# Patient Record
Sex: Female | Born: 1975 | Race: White | Hispanic: No | Marital: Single | Smoking: Never smoker
Health system: Southern US, Community
[De-identification: ages and names within clinical notes are randomized; demographics above are authoritative.]

## PROBLEM LIST (undated history)

## (undated) DIAGNOSIS — R945 Abnormal results of liver function studies: Secondary | ICD-10-CM

## (undated) DIAGNOSIS — K6289 Other specified diseases of anus and rectum: Secondary | ICD-10-CM

## (undated) DIAGNOSIS — R7989 Other specified abnormal findings of blood chemistry: Secondary | ICD-10-CM

## (undated) DIAGNOSIS — K625 Hemorrhage of anus and rectum: Secondary | ICD-10-CM

## (undated) DIAGNOSIS — Z5189 Encounter for other specified aftercare: Secondary | ICD-10-CM

## (undated) DIAGNOSIS — IMO0001 Reserved for inherently not codable concepts without codable children: Secondary | ICD-10-CM

## (undated) DIAGNOSIS — K859 Acute pancreatitis without necrosis or infection, unspecified: Secondary | ICD-10-CM

## (undated) DIAGNOSIS — H9193 Unspecified hearing loss, bilateral: Secondary | ICD-10-CM

## (undated) DIAGNOSIS — N200 Calculus of kidney: Secondary | ICD-10-CM

## (undated) DIAGNOSIS — D693 Immune thrombocytopenic purpura: Secondary | ICD-10-CM

## (undated) DIAGNOSIS — K648 Other hemorrhoids: Secondary | ICD-10-CM

## (undated) HISTORY — DX: Other specified abnormal findings of blood chemistry: R79.89

## (undated) HISTORY — DX: Immune thrombocytopenic purpura: D69.3

## (undated) HISTORY — DX: Encounter for other specified aftercare: Z51.89

## (undated) HISTORY — DX: Abnormal results of liver function studies: R94.5

## (undated) HISTORY — DX: Acute pancreatitis without necrosis or infection, unspecified: K85.90

## (undated) HISTORY — DX: Unspecified hearing loss, bilateral: H91.93

## (undated) HISTORY — DX: Calculus of kidney: N20.0

## (undated) HISTORY — DX: Other specified diseases of anus and rectum: K62.89

## (undated) HISTORY — PX: TUBAL LIGATION: SHX77

## (undated) HISTORY — DX: Reserved for inherently not codable concepts without codable children: IMO0001

## (undated) HISTORY — DX: Hemorrhage of anus and rectum: K62.5

---

## 2007-10-09 HISTORY — PX: NOVASURE ABLATION: SHX5394

## 2007-10-09 HISTORY — PX: CHOLECYSTECTOMY: SHX55

## 2009-07-02 ENCOUNTER — Ambulatory Visit: Payer: Self-pay | Admitting: Diagnostic Radiology

## 2009-07-02 ENCOUNTER — Emergency Department (HOSPITAL_BASED_OUTPATIENT_CLINIC_OR_DEPARTMENT_OTHER): Admission: EM | Admit: 2009-07-02 | Discharge: 2009-07-02 | Payer: Self-pay | Admitting: Emergency Medicine

## 2011-03-22 ENCOUNTER — Emergency Department (HOSPITAL_BASED_OUTPATIENT_CLINIC_OR_DEPARTMENT_OTHER)
Admission: EM | Admit: 2011-03-22 | Discharge: 2011-03-22 | Disposition: A | Discharge status: Home / Self Care | Payer: Self-pay | Attending: Emergency Medicine | Admitting: Emergency Medicine

## 2011-03-22 ENCOUNTER — Emergency Department (INDEPENDENT_AMBULATORY_CARE_PROVIDER_SITE_OTHER): Payer: Self-pay

## 2011-03-22 DIAGNOSIS — D696 Thrombocytopenia, unspecified: Secondary | ICD-10-CM | POA: Insufficient documentation

## 2011-03-22 DIAGNOSIS — R079 Chest pain, unspecified: Secondary | ICD-10-CM

## 2011-03-22 LAB — CBC
MCH: 32.2 pg (ref 26.0–34.0)
MCV: 91.5 fL (ref 78.0–100.0)
Platelets: 90 10*3/uL — ABNORMAL LOW (ref 150–400)
RBC: 3.88 MIL/uL (ref 3.87–5.11)

## 2011-03-22 LAB — DIFFERENTIAL
Basophils Relative: 0 % (ref 0–1)
Eosinophils Absolute: 0.1 10*3/uL (ref 0.0–0.7)
Lymphocytes Relative: 16 % (ref 12–46)
Lymphs Abs: 1.3 10*3/uL (ref 0.7–4.0)
Neutro Abs: 6.4 10*3/uL (ref 1.7–7.7)

## 2011-03-22 LAB — BASIC METABOLIC PANEL
BUN: 16 mg/dL (ref 6–23)
Calcium: 9.9 mg/dL (ref 8.4–10.5)
Creatinine, Ser: 0.7 mg/dL (ref 0.4–1.2)
GFR calc non Af Amer: >60 mL/min (ref >60)
Glucose, Bld: 112 mg/dL — ABNORMAL HIGH (ref 70–99)
Potassium: 3.6 mEq/L (ref 3.5–5.1)

## 2011-10-09 DIAGNOSIS — K648 Other hemorrhoids: Secondary | ICD-10-CM

## 2011-10-09 HISTORY — DX: Other hemorrhoids: K64.8

## 2011-10-12 ENCOUNTER — Encounter: Payer: Self-pay | Admitting: Internal Medicine

## 2011-11-05 ENCOUNTER — Encounter: Payer: Self-pay | Admitting: Family Medicine

## 2011-11-05 ENCOUNTER — Other Ambulatory Visit (HOSPITAL_COMMUNITY)
Admission: RE | Admit: 2011-11-05 | Discharge: 2011-11-05 | Disposition: A | Discharge status: Home / Self Care | Payer: BC Managed Care – PPO | Source: Ambulatory Visit | Attending: Family Medicine | Admitting: Family Medicine

## 2011-11-05 ENCOUNTER — Ambulatory Visit (INDEPENDENT_AMBULATORY_CARE_PROVIDER_SITE_OTHER): Payer: BC Managed Care – PPO | Admitting: Family Medicine

## 2011-11-05 VITALS — HR 85 | Temp 98.5°F | Ht 61.0 in | Wt 133.8 lb

## 2011-11-05 DIAGNOSIS — Z01419 Encounter for gynecological examination (general) (routine) without abnormal findings: Secondary | ICD-10-CM | POA: Insufficient documentation

## 2011-11-05 DIAGNOSIS — D693 Immune thrombocytopenic purpura: Secondary | ICD-10-CM

## 2011-11-05 DIAGNOSIS — N920 Excessive and frequent menstruation with regular cycle: Secondary | ICD-10-CM

## 2011-11-05 DIAGNOSIS — H9190 Unspecified hearing loss, unspecified ear: Secondary | ICD-10-CM

## 2011-11-05 DIAGNOSIS — L723 Sebaceous cyst: Secondary | ICD-10-CM

## 2011-11-05 DIAGNOSIS — Z0000 Encounter for general adult medical examination without abnormal findings: Secondary | ICD-10-CM

## 2011-11-05 DIAGNOSIS — H9193 Unspecified hearing loss, bilateral: Secondary | ICD-10-CM | POA: Insufficient documentation

## 2011-11-05 DIAGNOSIS — L729 Follicular cyst of the skin and subcutaneous tissue, unspecified: Secondary | ICD-10-CM

## 2011-11-05 LAB — BASIC METABOLIC PANEL
CO2: 28 mEq/L (ref 19–32)
Calcium: 9.6 mg/dL (ref 8.4–10.5)
Creatinine, Ser: 0.8 mg/dL (ref 0.4–1.2)
Glucose, Bld: 79 mg/dL (ref 70–99)

## 2011-11-05 LAB — LIPID PANEL
HDL: 69.6 mg/dL (ref >39.00)
Total CHOL/HDL Ratio: 3
VLDL: 8.2 mg/dL (ref 0.0–40.0)

## 2011-11-05 LAB — CBC WITH DIFFERENTIAL/PLATELET
Basophils Absolute: 0 10*3/uL (ref 0.0–0.1)
Eosinophils Absolute: 0 10*3/uL (ref 0.0–0.7)
Lymphocytes Relative: 21.5 % (ref 12.0–46.0)
MCHC: 33.9 g/dL (ref 30.0–36.0)
Neutrophils Relative %: 70.5 % (ref 43.0–77.0)
RBC: 3.71 Mil/uL — ABNORMAL LOW (ref 3.87–5.11)
RDW: 13.1 % (ref 11.5–14.6)

## 2011-11-05 LAB — POCT URINALYSIS DIPSTICK
Ketones, UA: NEGATIVE
Protein, UA: NEGATIVE
pH, UA: 5

## 2011-11-05 LAB — MICROALBUMIN / CREATININE URINE RATIO: Microalb, Ur: 0.6 mg/dL (ref 0.0–1.9)

## 2011-11-05 LAB — TSH: TSH: 1.19 u[IU]/mL (ref 0.35–5.50)

## 2011-11-05 LAB — HEPATIC FUNCTION PANEL
Alkaline Phosphatase: 46 U/L (ref 39–117)
Bilirubin, Direct: 0.1 mg/dL (ref 0.0–0.3)

## 2011-11-05 NOTE — Progress Notes (Signed)
Subjective:     Brittany Austin is a 36 y.o. female and is here for a comprehensive physical exam. The patient reports problems - heavy periods.  Pt had surgery  in [place]  to stop bleeding but it did not work.  pt requesting gyn referral here.  Pt also has cysts on scalp that are worsening and she would like to be referred for that as well.    History   Social History  . Marital Status: Single    Spouse Name: N/A    Number of Children: N/A  . Years of Education: N/A   Occupational History  . 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 Smoking status: Never Smoker   . Smokeless tobacco: Never Used  . Alcohol Use: Yes  . Drug Use: No  . Sexually Active: Not Currently   Other Topics Concern  . Not on file   Social History Narrative  . No narrative on file   Health Maintenance  Topic Date Due  . Pap Smear  09/17/1996  . Influenza Vaccine  07/08/2012  . Tetanus/tdap  06/08/2020    The following portions of the patient's history were reviewed and updated as appropriate: allergies, current medications, past family history, past medical history, past social history, past surgical history and problem list.  Review of Systems Review of Systems  Constitutional: Negative for activity change, appetite change and fatigue.  HENT: Negative for hearing loss, congestion, tinnitus and ear discharge.  dentist q24m Eyes: Negative for visual disturbance (see optho q1y -- vision corrected to 20/20 with glasses).  Respiratory: Negative for cough, chest tightness and shortness of breath.   Cardiovascular: Negative for chest pain, palpitations and leg swelling.  Gastrointestinal: Negative for abdominal pain, diarrhea, constipation and abdominal distention.  Genitourinary: Negative for urgency, frequency, decreased urine volume and difficulty urinating.  Musculoskeletal: Negative for back pain, arthralgias and gait problem.  Skin: Negative for color change, pallor  and rash.  Neurological: Negative for dizziness, light-headedness, numbness and headaches.  Hematological: Negative for adenopathy. Does not bruise/bleed easily.  Psychiatric/Behavioral: Negative for suicidal ideas, confusion, sleep disturbance, self-injury, dysphoric mood, decreased concentration and agitation.       Objective:    BP 110/68  Pulse 85  Temp(Src) 98.5 F (36.9 C) (Oral)  Ht 5\' 1"  (1.549 m)  Wt 133 lb 12.8 oz (60.691 kg)  BMI 25.28 kg/m2  SpO2 98%  LMP 10/18/2011 General appearance: alert, cooperative, appears stated age and no distress Head: Normocephalic, without obvious abnormality, atraumatic Eyes: conjunctivae/corneas clear. PERRL, EOM's intact. Fundi benign. Ears: normal TM's and external ear canals both ears Nose: Nares normal. Septum midline. Mucosa normal. No drainage or sinus tenderness. Throat: lips, mucosa, and tongue normal; teeth and gums normal Neck: no adenopathy, no carotid bruit, no JVD, supple, symmetrical, trachea midline and thyroid not enlarged, symmetric, no tenderness/mass/nodules Back: symmetric, no curvature. ROM normal. No CVA tenderness. Lungs: clear to auscultation bilaterally Breasts: normal appearance, no masses or tenderness Heart: regular rate and rhythm, S1, S2 normal, no murmur, click, rub or gallop Abdomen: soft, non-tender; bowel sounds normal; no masses,  no organomegaly Pelvic: cervix normal in appearance, external genitalia normal, no adnexal masses or tenderness, no cervical motion tenderness, rectovaginal septum normal, uterus normal size, shape, and consistency and vagina normal without discharge Extremities: extremities normal, atraumatic, no cyanosis or edema Pulses: 2+ and symmetric Skin: Skin color, texture, turgor normal. No rashes or lesions Lymph nodes: Cervical, supraclavicular, and axillary  nodes normal. Neurologic: Alert and oriented X 3, normal strength and tone. Normal symmetric reflexes. Normal coordination  and gait psych-- no depression or anxiety    Assessment:    Healthy female exam.  ITP-- stable per pt Hearing loss===  B/L hearing aids     Plan:    ghm utd Check fasting labs See After Visit Summary for Counseling Recommendations

## 2011-11-05 NOTE — Patient Instructions (Signed)

## 2011-11-05 NOTE — Progress Notes (Signed)
Addended by: Legrand Como on: 11/05/2011 01:27 PM   Modules accepted: Orders

## 2011-11-06 ENCOUNTER — Ambulatory Visit (INDEPENDENT_AMBULATORY_CARE_PROVIDER_SITE_OTHER): Payer: BC Managed Care – PPO | Admitting: Internal Medicine

## 2011-11-06 ENCOUNTER — Ambulatory Visit: Payer: Self-pay | Admitting: Internal Medicine

## 2011-11-06 ENCOUNTER — Encounter: Payer: Self-pay | Admitting: Internal Medicine

## 2011-11-06 VITALS — BP 92/50 | HR 72 | Ht 59.0 in | Wt 132.6 lb

## 2011-11-06 DIAGNOSIS — K5909 Other constipation: Secondary | ICD-10-CM

## 2011-11-06 DIAGNOSIS — K602 Anal fissure, unspecified: Secondary | ICD-10-CM

## 2011-11-06 DIAGNOSIS — K648 Other hemorrhoids: Secondary | ICD-10-CM

## 2011-11-06 DIAGNOSIS — K5900 Constipation, unspecified: Secondary | ICD-10-CM

## 2011-11-06 LAB — RUBEOLA ANTIBODY IGG: Rubeola IgG: 7.53 {ISR} — ABNORMAL HIGH

## 2011-11-06 LAB — MUMPS ANTIBODY, IGG: Mumps IgG: 5.18 {ISR} — ABNORMAL HIGH

## 2011-11-06 MED ORDER — POLYETHYLENE GLYCOL 3350 17 GM/SCOOP PO POWD: 17.0000 g | Freq: Every day | ORAL | Status: DC

## 2011-11-06 MED ORDER — HYDROCORTISONE ACETATE 25 MG RE SUPP: 25.0000 mg | Freq: Every day | RECTAL | Status: AC

## 2011-11-06 MED ORDER — AMBULATORY NON FORMULARY MEDICATION: 1.0000 "application " | Freq: Two times a day (BID) | Status: DC

## 2011-11-06 NOTE — Progress Notes (Signed)
Subjective:    Patient ID: Brittany Austin, female    DOB: 1976/09/28, 36 y.o.   MRN: [id number]  HPI She believes she has had hemorrhoids for a number of years. Had seen a gastroenterologist in 2009 [facility]) for abdominal pain and cholecystectomty was recommended for gallbladder dyskinesia. That relieved her pain. Back then she was having anal itching and was found hemorrhoids on exam. Over the past few months she went on a weight loss diet - Ally plan without the medication. She has always gone several days without defecation and that worsened some and the stools became larger and harder on the diet. She would finally pass a large hard stool and followed by softer stools. Began to use mineral oil enemas every 3 days to promote defecation. She started having my "hemorrhoids split and bleed and would have painful defecation. Now using pine nut oil to improve defecation. Moving bowels about daily now. Still hard, seeing pellet-like stools. Still has anorectal pain with defecation, sharp and tearing. Only seeing blood on toilet paper except for 1-2 x in toilet. Always bright red.Some abdominal pain and sense of defecation. She tried stool softeners and a probiotic without relief. Kiwi fruit helps. She does not tolerate fiber - it gives me very loose stools (Raisin Bran did)  No Known Allergies Outpatient Prescriptions Prior to Visit  Medication Sig Dispense Refill  . Multiple Vitamin (MULTIVITAMIN) tablet Take 1 tablet by mouth daily.       Past Medical History  Diagnosis Date  . ITP (idiopathic thrombocytopenic purpura)   . Hearing loss of both ears   . Kidney stones    Past Surgical History  Procedure Date  . Cholecystectomy   . Cesarean section     x's 2  . Novasure ablation   . Tubal ligation    History   Social History  . Marital Status: Single    Spouse Name: N/A    Number of Children: 2  . Years of Education: N/A   Occupational History  . 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 Smoking status: Never Smoker   . Smokeless tobacco: Never Used  . Alcohol Use: No  . Drug Use: No  . Sexually Active: Not Currently   Other Topics Concern  . None   Social History Narrative  . None   Family History  Problem Relation Age of Onset  . Arthritis    . Colon cancer Paternal Uncle   . Hyperlipidemia Mother   . Hyperlipidemia Father   . Heart disease Mother   . Heart disease Paternal Grandmother   . Stroke Maternal Grandmother   . Hypertension Mother   . Diabetes Mother   . Diabetes Maternal Grandmother   . Diabetes Paternal Grandmother   . Cancer Paternal Grandfather     unknown source        Review of Systems Denies any other active problems on a complete review of systems other than chronic hearing loss.Thought to be congenital.    Objective:   Physical Exam General:  Well-developed, well-nourished and in no acute distress Eyes:  anicteric. ENT:   Mouth and posterior pharynx free of lesions.  Neck:   supple w/o thyromegaly or mass.  Lungs: Clear to auscultation bilaterally. Heart:  S1S2, no rubs, murmurs, gallops. Abdomen:  soft, non-tender, no hepatosplenomegaly, hernia, or mass and BS+. Striae and very   loose skin Rectal: Female staff present - normal anoderm except for small tags, ?  Posterior fissure. Mildly   tender to DRE. No mass. Firm brown stool present. Anoscopy: Internal hemorrhoids seen, 3 columns suspected, swollen and violaceous Lymph:  no cervical or supraclavicular adenopathy. Extremities:   no edema Skin   no rash. Neuro:  A&O x 3.  Psych:  appropriate mood and  Affect.   Data Reviewed: Lab Results  Component Value Date   TSH 1.19 11/05/2011   Lab Results  Component Value Date   WBC 5.0 11/05/2011   HGB 12.2 11/05/2011   HCT 36.1 11/05/2011   MCV 97.3 11/05/2011   PLT 84.0* 11/05/2011           Assessment & Plan:   1. Hemorrhoids, internal, with bleeding   2. Anal fissure   3.  Chronic constipation    The clinical scenario along with physical findings and anoscopy is compatible with constipation leading to hemorrhoids and possible anal fissure. She is not exquisitely tender but there are some symptoms of fissure. I'm going to treat her constipation with MiraLax daily. She says fiber is too much for her. We may need to come back to something like Metamucil but at this point will be aggressive with her daily dose of MiraLax, and she can take that twice a day if needed. In addition we'll treat her hemorrhoids with Anusol-HC and also treat with diltiazem gel for fissure symptoms. I will see her back in 6 weeks. I've explaining the nature things and we have given her educational benefits on all 3 of these problems. Further plans pending clinical course.

## 2011-11-06 NOTE — Patient Instructions (Addendum)
Your prescription(s) has(have) been sent to your pharmacy for you to pick up (Diltiazem gel, Anusol supp). Please take a dose of Miralax 17 gm in 6-8 oz of liquid you can take up to twice a day as needed, you can purchase this over-the-counter. You have been given handouts on constipation, hemorrhoids,and anal fissure for you to read and follow. Return to see Dr. Leone Payor in 6 weeks.

## 2011-11-08 LAB — RUBELLA ANTIBODY, IGM: Rubella IgM: 0.32 Ratio (ref <0.90)

## 2011-11-14 ENCOUNTER — Ambulatory Visit (INDEPENDENT_AMBULATORY_CARE_PROVIDER_SITE_OTHER): Payer: BC Managed Care – PPO | Admitting: Surgery

## 2011-11-15 ENCOUNTER — Ambulatory Visit: Payer: BC Managed Care – PPO

## 2011-11-19 ENCOUNTER — Encounter: Payer: Self-pay | Admitting: Family Medicine

## 2011-11-29 ENCOUNTER — Telehealth: Payer: Self-pay | Admitting: Family Medicine

## 2011-11-29 ENCOUNTER — Telehealth: Payer: Self-pay | Admitting: Hematology & Oncology

## 2011-11-29 DIAGNOSIS — D691 Qualitative platelet defects: Secondary | ICD-10-CM

## 2011-11-29 NOTE — Telephone Encounter (Signed)
Pt aware of 3-29 appointment

## 2011-11-29 NOTE — Telephone Encounter (Signed)
Left pt message to call for appointment details, i have scheduled her for 01-04-12 per her request on referral note

## 2011-11-29 NOTE — Telephone Encounter (Signed)
Referral put in     KP 

## 2011-11-29 NOTE — Telephone Encounter (Signed)
Ok to refer.

## 2011-11-29 NOTE — Telephone Encounter (Signed)
Patient is requesting a referral to a specialist for her low platelets. She states she would prefer to have an appointment made between 3/29-4/5.

## 2011-12-19 ENCOUNTER — Ambulatory Visit (INDEPENDENT_AMBULATORY_CARE_PROVIDER_SITE_OTHER): Payer: BC Managed Care – PPO | Admitting: Surgery

## 2011-12-19 ENCOUNTER — Encounter (INDEPENDENT_AMBULATORY_CARE_PROVIDER_SITE_OTHER): Payer: Self-pay | Admitting: Surgery

## 2011-12-19 DIAGNOSIS — L7211 Pilar cyst: Secondary | ICD-10-CM

## 2011-12-19 NOTE — Progress Notes (Signed)
Reffering physician: Lelon Perla, DO  CC: Cyst of scalp HPI: This patient comes in on referral from her primary physician for evaluation of multiple cysts of the scalp. They have been present for several years. There is one close to the front of her scalp and one on the right occipital area which are asymptomatic. There is one on the left side occipital region that is tender when she lies down and give her some problems with sleep. ROS: This is positive for hearing loss and she wears hearing aids. She has had problems with blood in her stool and apparently has been diagnosed with a fissure. She also has some pain in the anal area from the fissure. Of note is that she has ITP and has been diagnosed with that for over 12 years.  MEDS:  Current Outpatient Prescriptions  Medication Sig Dispense Refill  . AMBULATORY NON FORMULARY MEDICATION Apply 1 application topically 2 (two) times daily. Medication Name: Diltiazem 2% gel apply small amount (pea-sized) to anal area  30 g  0  . Cyanocobalamin (VITAMIN B-12 CR PO) Take 1 tablet by mouth daily.      . hydrocortisone (ANUSOL-HC) 25 MG suppository Place 1 suppository (25 mg total) rectally at bedtime. For 1 week then as needed for bleeding  24 suppository  0  . Multiple Vitamin (MULTIVITAMIN) tablet Take 1 tablet by mouth daily.      . NON FORMULARY Pine Nut Oil  1 teaspoon 1-2 times qd      . polyethylene glycol powder (GLYCOLAX/MIRALAX) powder Take 17 g by mouth daily. Can take up to twice a day as needed  255 g  0    ALLERGIES:  No Known Allergies  PE VS; BP 118/70  Pulse 64  Temp(Src) 97.9 F (36.6 C) (Temporal)  Resp 16  Ht 4\' 11"  (1.499 m)  Wt 133 lb 6.4 oz (60.51 kg)  BMI 26.94 kg/m2  General: The patient is alert oriented and healthy-appearing Scalp: There are three cystic mass iconsistent with pilar cysts. There is no evidence of infection.  Data Reviewed I have reviewed her like chronic medical  record.  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 The symptomatic one DD optimal in one to remove but all three could be done. I told her she needs to have seen the hematologist before performing any surgery. She also asked about surgery for her anal fissure and I discussed briefly with her. Again risks of bleeding with surgery are present because of her ITP and we need full discussions with her in the hematologist before we made plans to do any surgical intervention.  She has an appointment with the hematologist next week. If she decides to have surgery for the scalp cysts she will call me back.  Cc: Lelon Perla, DO

## 2011-12-19 NOTE — Patient Instructions (Signed)
If you decide to have surgery to remove the cyst of your scalp call and we can set this up. You need to discuss this with your hematologist before we do any surgery.

## 2012-01-04 ENCOUNTER — Ambulatory Visit (HOSPITAL_BASED_OUTPATIENT_CLINIC_OR_DEPARTMENT_OTHER): Payer: BC Managed Care – PPO | Admitting: Hematology & Oncology

## 2012-01-04 ENCOUNTER — Ambulatory Visit (HOSPITAL_BASED_OUTPATIENT_CLINIC_OR_DEPARTMENT_OTHER): Payer: BC Managed Care – PPO

## 2012-01-04 ENCOUNTER — Other Ambulatory Visit (HOSPITAL_BASED_OUTPATIENT_CLINIC_OR_DEPARTMENT_OTHER): Payer: BC Managed Care – PPO | Admitting: Lab

## 2012-01-04 VITALS — BP 112/72 | HR 64 | Temp 97.7°F | Ht 59.0 in | Wt 136.0 lb

## 2012-01-04 DIAGNOSIS — D693 Immune thrombocytopenic purpura: Secondary | ICD-10-CM

## 2012-01-04 LAB — CBC WITH DIFFERENTIAL (CANCER CENTER ONLY)
BASO#: 0 10*3/uL (ref 0.0–0.2)
BASO%: 0.4 % (ref 0.0–2.0)
EOS%: 1.3 % (ref 0.0–7.0)
HGB: 12.3 g/dL (ref 11.6–15.9)
LYMPH#: 1.2 10*3/uL (ref 0.9–3.3)
MCHC: 33.6 g/dL (ref 32.0–36.0)
NEUT#: 2.9 10*3/uL (ref 1.5–6.5)
Platelets: 92 10*3/uL — ABNORMAL LOW (ref 145–400)

## 2012-01-04 LAB — CHCC SATELLITE - SMEAR

## 2012-01-04 LAB — TECHNOLOGIST REVIEW CHCC SATELLITE: Tech Review: NONE SEEN

## 2012-01-04 NOTE — Progress Notes (Signed)
This office note has been dictated.

## 2012-01-04 NOTE — Progress Notes (Signed)
CC:   Currie Paris, M.D. Lelon Perla, DO  DIAGNOSIS:  Immune thrombocytopenia-mild.  HISTORY OF PRESENT ILLNESS:  Ms. Chiao is a very nice 36 year old white female.  She is followed Dr. Loreen Freud.  The problem that we have is that she apparently needs to have surgery by Dr. Jamey Ripa.  She has some cysts on her scalp.  These apparently need to be resected.  Because of her diagnosis of ITP, it was felt that she needed to be seen by Hematology before any surgical intervention was contemplated.  She really has not had any problems with the ITP.  She has had no bleeding.  She has 2 kids.  They were done by cesarean section.  Going back to February 2012, she had a CBC done which showed a white count of 5, hemoglobin 12.2, hematocrit 36.1, and platelet count 84,000.  Again, there has been no spontaneous bleeding.  She does have her monthly cycles.  They have been on the heavy side, but this has been more related to abnormal urine function.  She did undergo, I think, uterine lining ablation.  She has had no problems with weight loss or weight gain.  She apparently lost over 50 pounds the past 6 months.  She is quite happy with this. She is thinking about having some kind of surgery to help with the excess skin on her abdomen.  Going back to June 2012, her platelet count was 90,000.  She has not noted any problems with her diet.  She has had no nausea or vomiting.  There has been no change in bowel or bladder habits.  She does have some hemorrhoidal-type issues.  This also may need to be attended to surgically by Dr. Jamey Ripa.  Ms. Subramanian had a gallbladder out in 2009.  There was in [place]. She has had no problems with this surgery.  Again, we were asked to see her for the thrombocytopenia and to see if she needed any preop "tune-up."  PAST MEDICAL HISTORY:  Remarkable for: 1. Immune thrombocytopenia. 2. Nephrolithiasis . 3. 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  ALLERGIES:  None.  MEDICATIONS: 1. B12 1 p.o. daily. 2. MiraLAX 17 g p.o. daily.  SOCIAL HISTORY:  Remarkable for no tobacco use.  There is no alcohol use.  She works for the [employer].  FAMILY HISTORY:  Her family history is relatively noncontributory. There is a history of heart disease, diabetes, CVA, and hypertension. There is a history of colon cancer in a paternal uncle.  REVIEW OF SYSTEMS:  As stated in the history of present illness.  No additional findings noted on a 12-system review.  PHYSICAL EXAM:  General: This is a somewhat petite white female in no obvious distress.  Vital signs: Show a temperature of 97.7, pulse 64, heart rate 18, blood pressure 112/72, and weight is 136.  Head and neck exam shows a normocephalic, atraumatic skull.  She has no ocular or oral lesions.  There is no scleral icterus.  There is no adenopathy in her neck.  Her thyroid is nonpalpable.  Lungs are clear bilaterally. Cardiac exam: Regular rate and rhythm with a normal S1 and S2.  There are no murmurs, rubs, or bruits.  Abdominal exam: Soft with good bowel sounds.  There is no palpable abdominal mass.  There is no palpable hepatosplenomegaly.  Back exam:  No tenderness and tenderness over the spine, ribs, or hips.  Extremities: Shows no clubbing, cyanosis, or edema.  She has good range motion of her joints.  There is no joint swelling, erythema, or warmth.  Skin exam:  Shows some scattered ecchymoses.  I see no petechia.  She has no rashes.  Neurological exam: Shows no focal neurological deficits.  LABORATORY STUDIES:  Show a white cell count of 4.6, hemoglobin 12.3, hematocrit 36.6, and platelet count 92,003. MCV is 97.  Peripheral smear shows a normochromic, normocytic population of red blood cells.  There are no schistocytes.  I see no Rouleaux formation.  She has no target cells.  There are no nucleated red blood cells.  White cells appear normal in  morphology and maturation.  She has no hypersegmented polys. There are no immature myeloid cells.  There is no atypical lymphocytes. Platelets are mildly decreased in number.  I see she has numerous large platelets that are well granulated.  IMPRESSION:  Ms. Kobs is a very charming 36 year old white female with immune thrombocytopenia.  This apparently was diagnosed with her 1st pregnancy.  She said that she got "a transfusion" with the pregnancy.  She has had gallbladder surgery in the past.  This was laparoscopically done. She had no problems of bleeding with this.  She says that she did not get any platelets before the procedure.  With her platelet count 92,000, I just could not imagine her having any bleeding issues.  With immune thrombocytopenia, the platelets are very functional.  As such, even if her platelet count was 50,000, she would have no problems bleeding.  I told her that she is not to take any nonsteroidal product or aspirin- containing products for a week before any planned surgery.  She does not need to have a bone marrow biopsy done.  This will not add to what we know already.  I do not see that we need to put her through a bunch of expensive blood test.  The whole clinical scenario is quite consistent with immune thrombocytopenia.  For now, I do not see that we need to get Ms. Housley back to the office. She is not planning on getting pregnant again.  Her tubes tied.  I just do not anticipate any issues with respect to surgery for Ms. Golden Hurter. I told Ms. Magnussen that she certainly could call us if she ever needed to have blood work done or needed to be seen, and we would we be more than happy to do that.  I spent a good hour or more with Ms. Fitzner and her family.  They are all very nice.  We had excellent fellowship today.    ______________________________ Josph Macho, M.D. PRE/MEDQ  D:  01/04/2012  T:  01/04/2012  Job:  [id number]

## 2012-11-22 ENCOUNTER — Other Ambulatory Visit: Payer: Self-pay

## 2013-01-26 ENCOUNTER — Emergency Department (HOSPITAL_BASED_OUTPATIENT_CLINIC_OR_DEPARTMENT_OTHER): Admission: EM | Admit: 2013-01-26 | Payer: BC Managed Care – PPO | Source: Home / Self Care

## 2013-01-29 ENCOUNTER — Encounter: Payer: Self-pay | Admitting: Internal Medicine

## 2013-01-29 ENCOUNTER — Ambulatory Visit (INDEPENDENT_AMBULATORY_CARE_PROVIDER_SITE_OTHER): Payer: BC Managed Care – PPO | Admitting: Internal Medicine

## 2013-01-29 ENCOUNTER — Telehealth: Payer: Self-pay | Admitting: Internal Medicine

## 2013-01-29 VITALS — BP 110/72 | HR 81 | Temp 97.6°F | Resp 18 | Ht 59.0 in | Wt 129.0 lb

## 2013-01-29 DIAGNOSIS — Z87440 Personal history of urinary (tract) infections: Secondary | ICD-10-CM

## 2013-01-29 DIAGNOSIS — Z0000 Encounter for general adult medical examination without abnormal findings: Secondary | ICD-10-CM

## 2013-01-29 DIAGNOSIS — N946 Dysmenorrhea, unspecified: Secondary | ICD-10-CM

## 2013-01-29 LAB — CBC WITH DIFFERENTIAL/PLATELET
Eosinophils Relative: 2 % (ref 0–5)
HCT: 35.9 % — ABNORMAL LOW (ref 36.0–46.0)
Hemoglobin: 12.5 g/dL (ref 12.0–15.0)
Lymphocytes Relative: 21 % (ref 12–46)
Lymphs Abs: 1.3 10*3/uL (ref 0.7–4.0)
MCV: 93.7 fL (ref 78.0–100.0)
Monocytes Absolute: 0.5 10*3/uL (ref 0.1–1.0)
Neutro Abs: 4.2 10*3/uL (ref 1.7–7.7)
RBC: 3.83 MIL/uL — ABNORMAL LOW (ref 3.87–5.11)
WBC: 6.2 10*3/uL (ref 4.0–10.5)

## 2013-01-29 LAB — COMPREHENSIVE METABOLIC PANEL
ALT: 15 U/L (ref 0–35)
AST: 18 U/L (ref 0–37)
Albumin: 4.7 g/dL (ref 3.5–5.2)
BUN: 13 mg/dL (ref 6–23)
CO2: 29 mEq/L (ref 19–32)
Calcium: 10 mg/dL (ref 8.4–10.5)
Chloride: 103 mEq/L (ref 96–112)
Creat: 0.75 mg/dL (ref 0.50–1.10)
Potassium: 4.4 mEq/L (ref 3.5–5.3)

## 2013-01-29 LAB — POCT URINALYSIS DIPSTICK
Bilirubin, UA: NEGATIVE
Blood, UA: NEGATIVE
Leukocytes, UA: NEGATIVE
Nitrite, UA: NEGATIVE
Protein, UA: NEGATIVE
Urobilinogen, UA: NEGATIVE
pH, UA: 7.5

## 2013-01-29 LAB — LIPID PANEL: LDL Cholesterol: 62 mg/dL (ref 0–99)

## 2013-01-29 NOTE — Patient Instructions (Addendum)
Will refer to Dr. Marice Potter  Get labs today

## 2013-01-29 NOTE — Progress Notes (Signed)
Subjective:    Patient ID: Brittany Austin, female    DOB: Feb 28, 1976, 37 y.o.   MRN: [id number]  HPI  New pt here for first visit.  Former care Dr. Laury Axon.  PMH of ITP evaluated by Dr. Myna Hidalgo,  Pilar and pilar cyst.  She report she is on Macrobid now for UTI treated at [facility] med center  UTI symptoms much better.    She also notes  Severe dysmennorhea.  She tells me she had a uterine ablation in 2009 while living in [place].  Since procedure she has been having a monthly menses.   LMP 4/8  She is a G2AB2.  Pt tells me she was referred to [facility] hospital by Dr. Laury Axon but she did not make it.     I do not have records for [place]  Monthly cycle 7 day duration flow not heavy  No Known Allergies Past Medical History  Diagnosis Date  . ITP (idiopathic thrombocytopenic purpura)   . Hearing loss of both ears   . Kidney stones   . Blood transfusion   . Cysts     scalp and left upper arm  . Rectal bleeding   . Blood in stool     sometimes  . Rectal pain    Past Surgical History  Procedure Laterality Date  . Novasure ablation  2009  . Tubal ligation    . Cholecystectomy  2009  . Cesarean section  1999, 2001    x's 2   History   Social History  . Marital Status: Single    Spouse Name: N/A    Number of Children: 2  . Years of Education: N/A   Occupational History  . 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 Smoking status: Never Smoker   . Smokeless tobacco: Never Used  . Alcohol Use: No  . Drug Use: No  . Sexually Active: Not Currently   Other Topics Concern  . Not on file   Social History Narrative  . No narrative on file   Family History  Problem Relation Age of Onset  . Arthritis    . Colon cancer Paternal Uncle   . Cancer Paternal Uncle     colon  . Hyperlipidemia Mother   . Heart disease Mother   . Hypertension Mother   . Diabetes Mother   . Hyperlipidemia Father   . Heart disease Paternal Grandmother   .  Diabetes Paternal Grandmother   . Stroke Maternal Grandmother   . Diabetes Maternal Grandmother   . Other Maternal Grandmother     brain tumor  . Cancer Paternal Grandfather     unknown source  . Diabetes Sister   . Cancer Paternal Aunt     uterine   Patient Active Problem List  Diagnosis  . ITP (idiopathic thrombocytopenic purpura)  . Hearing loss of both ears  . Pilar cyst  . Dysmenorrhea   Current Outpatient Prescriptions on File Prior to Visit  Medication Sig Dispense Refill  . AMBULATORY NON FORMULARY MEDICATION Apply 1 application topically 2 (two) times daily. Medication Name: Diltiazem 2% gel apply small amount (pea-sized) to anal area  30 g  0  . Cyanocobalamin (VITAMIN B-12 CR PO) Take 1 tablet by mouth daily.      . Multiple Vitamin (MULTIVITAMIN) tablet Take 1 tablet by mouth daily.      . NON FORMULARY Pine Nut Oil  1 teaspoon 1-2 times qd      .  polyethylene glycol powder (GLYCOLAX/MIRALAX) powder Take 17 g by mouth daily. Can take up to twice a day as needed  255 g  0   No current facility-administered medications on file prior to visit.       Review of Systems See HPI    Objective:   Physical Exam Physical Exam  Nursing note and vitals reviewed.  Constitutional: She is oriented to person, place, and time. She appears well-developed and well-nourished.  HENT:  Head: Normocephalic and atraumatic.  Cardiovascular: Normal rate and regular rhythm. Exam reveals no gallop and no friction rub.  No murmur heard.  Pulmonary/Chest: Breath sounds normal. She has no wheezes. She has no rales.  Neurological: She is alert and oriented to person, place, and time.  Skin: Skin is warm and dry.  Psychiatric: She has a normal mood and affect. Her behavior is normal.             Assessment & Plan:  ITP  Will check  Platelets with CBC today  .  Evaluated by Dr. Myna Hidalgo  Recent UTI  U/A today normal  Finish  macrobid  Post ablation bleeding  Will refer to Dr.  Marice Potter.    Labs today   See me prn

## 2013-01-29 NOTE — Telephone Encounter (Signed)
Pt scheduled with Dr Marice Potter at [facility].  Scheduled  Tuesday May 6,2014 at 3:30 pm.  Pt made aware of appt in the office.

## 2013-01-30 LAB — TSH: TSH: 0.894 u[IU]/mL (ref 0.350–4.500)

## 2013-02-01 ENCOUNTER — Encounter: Payer: Self-pay | Admitting: Internal Medicine

## 2013-02-02 ENCOUNTER — Encounter: Payer: Self-pay | Admitting: *Deleted

## 2013-02-10 ENCOUNTER — Encounter: Payer: BC Managed Care – PPO | Admitting: Obstetrics & Gynecology

## 2013-02-11 ENCOUNTER — Encounter: Payer: BC Managed Care – PPO | Admitting: Obstetrics & Gynecology

## 2013-03-11 ENCOUNTER — Encounter: Payer: Self-pay | Admitting: Obstetrics & Gynecology

## 2013-03-11 ENCOUNTER — Ambulatory Visit (INDEPENDENT_AMBULATORY_CARE_PROVIDER_SITE_OTHER): Payer: BC Managed Care – PPO | Admitting: Obstetrics & Gynecology

## 2013-03-11 VITALS — BP 132/73 | HR 86 | Resp 16 | Ht 59.0 in | Wt 131.0 lb

## 2013-03-11 DIAGNOSIS — N946 Dysmenorrhea, unspecified: Secondary | ICD-10-CM

## 2013-03-11 DIAGNOSIS — Z124 Encounter for screening for malignant neoplasm of cervix: Secondary | ICD-10-CM

## 2013-03-11 DIAGNOSIS — Z0000 Encounter for general adult medical examination without abnormal findings: Secondary | ICD-10-CM

## 2013-03-11 DIAGNOSIS — Z01419 Encounter for gynecological examination (general) (routine) without abnormal findings: Secondary | ICD-10-CM

## 2013-03-11 DIAGNOSIS — Z1151 Encounter for screening for human papillomavirus (HPV): Secondary | ICD-10-CM

## 2013-03-11 NOTE — Progress Notes (Signed)
  Subjective:    Patient ID: Brittany Austin, female    DOB: 12/27/1975, 37 y.o.   MRN: [id number]  HPI  37 yo SW P2 who was referred here for dysmenorrhea. She had an ablation in [place] in 2009 to treat dysmenorrhea and menorrhagia. She still has monthly periods that last 7 days but they are no longer super heavy with clots. Her biggest complaint is that of "a few hours" of severe cramping just as her period starts. It is relieved with IBU 600 mg. She denies dyspareunia (same sex relationship).  Review of Systems     Objective:   Physical Exam  Normal cervix, pap obtained NSS mid plane, NT, mobile, normal adnexal exam      Assessment & Plan:  Preventative care- pap smear done with HPV cotesting Dysmenorrhea associated with the onset of her periods. I have given her reassurance and have recommended increasing her IBU to 800 mg prn. Her main concern seems to be her fear of cancer. She has had 3 female and 1 female relatives die of cancer. I have reassured her about the odds of her having a gyn cancer. 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 RTC 1 year/prn sooner

## 2013-03-19 ENCOUNTER — Telehealth: Payer: Self-pay | Admitting: *Deleted

## 2013-03-19 DIAGNOSIS — B373 Candidiasis of vulva and vagina: Secondary | ICD-10-CM

## 2013-03-19 MED ORDER — FLUCONAZOLE 150 MG PO TABS: 150.0000 mg | ORAL_TABLET | Freq: Once | ORAL | Status: DC

## 2013-03-19 NOTE — Telephone Encounter (Signed)
LM on voicemail that her pap smear was normal but there were some yeast present and that a RX for Diflucan was sent to her [facility] pharmacy

## 2013-04-07 ENCOUNTER — Inpatient Hospital Stay (HOSPITAL_BASED_OUTPATIENT_CLINIC_OR_DEPARTMENT_OTHER)
Admission: EM | Admit: 2013-04-07 | Discharge: 2013-04-11 | DRG: 204 | Disposition: A | Discharge status: Home / Self Care | Payer: BC Managed Care – PPO | Attending: Internal Medicine | Admitting: Internal Medicine

## 2013-04-07 ENCOUNTER — Encounter (HOSPITAL_BASED_OUTPATIENT_CLINIC_OR_DEPARTMENT_OTHER): Payer: Self-pay | Admitting: *Deleted

## 2013-04-07 ENCOUNTER — Emergency Department (HOSPITAL_BASED_OUTPATIENT_CLINIC_OR_DEPARTMENT_OTHER): Payer: BC Managed Care – PPO

## 2013-04-07 DIAGNOSIS — D693 Immune thrombocytopenic purpura: Secondary | ICD-10-CM | POA: Diagnosis present

## 2013-04-07 DIAGNOSIS — K859 Acute pancreatitis without necrosis or infection, unspecified: Principal | ICD-10-CM | POA: Diagnosis present

## 2013-04-07 DIAGNOSIS — I771 Stricture of artery: Secondary | ICD-10-CM

## 2013-04-07 DIAGNOSIS — H9193 Unspecified hearing loss, bilateral: Secondary | ICD-10-CM

## 2013-04-07 DIAGNOSIS — D696 Thrombocytopenia, unspecified: Secondary | ICD-10-CM

## 2013-04-07 DIAGNOSIS — K759 Inflammatory liver disease, unspecified: Secondary | ICD-10-CM | POA: Diagnosis present

## 2013-04-07 DIAGNOSIS — I774 Celiac artery compression syndrome: Secondary | ICD-10-CM

## 2013-04-07 DIAGNOSIS — K8050 Calculus of bile duct without cholangitis or cholecystitis without obstruction: Secondary | ICD-10-CM

## 2013-04-07 DIAGNOSIS — N946 Dysmenorrhea, unspecified: Secondary | ICD-10-CM

## 2013-04-07 DIAGNOSIS — R1013 Epigastric pain: Secondary | ICD-10-CM

## 2013-04-07 DIAGNOSIS — H9190 Unspecified hearing loss, unspecified ear: Secondary | ICD-10-CM | POA: Diagnosis present

## 2013-04-07 HISTORY — DX: Other hemorrhoids: K64.8

## 2013-04-07 HISTORY — DX: Stricture of artery: I77.1

## 2013-04-07 HISTORY — DX: Celiac artery compression syndrome: I77.4

## 2013-04-07 LAB — COMPREHENSIVE METABOLIC PANEL
ALT: 35 U/L (ref 0–35)
AST: 62 U/L — ABNORMAL HIGH (ref 0–37)
Albumin: 4.7 g/dL (ref 3.5–5.2)
Alkaline Phosphatase: 56 U/L (ref 39–117)
Calcium: 10.1 mg/dL (ref 8.4–10.5)
Potassium: 3.8 mEq/L (ref 3.5–5.1)
Sodium: 139 mEq/L (ref 135–145)
Total Protein: 7.8 g/dL (ref 6.0–8.3)

## 2013-04-07 LAB — CBC WITH DIFFERENTIAL/PLATELET
Basophils Absolute: 0 10*3/uL (ref 0.0–0.1)
Basophils Relative: 0 % (ref 0–1)
Eosinophils Absolute: 0.1 10*3/uL (ref 0.0–0.7)
Hemoglobin: 13.4 g/dL (ref 12.0–15.0)
Lymphocytes Relative: 13 % (ref 12–46)
MCHC: 35.2 g/dL (ref 30.0–36.0)
Neutrophils Relative %: 78 % — ABNORMAL HIGH (ref 43–77)
Platelets: 80 10*3/uL — ABNORMAL LOW (ref 150–400)

## 2013-04-07 LAB — URINE MICROSCOPIC-ADD ON

## 2013-04-07 LAB — URINALYSIS, ROUTINE W REFLEX MICROSCOPIC
Bilirubin Urine: NEGATIVE
Hgb urine dipstick: NEGATIVE
Specific Gravity, Urine: 1.02 (ref 1.005–1.030)
pH: 7.5 (ref 5.0–8.0)

## 2013-04-07 LAB — PREGNANCY, URINE: Preg Test, Ur: NEGATIVE

## 2013-04-07 MED ORDER — OXYCODONE HCL 5 MG PO TABS
5.0000 mg | ORAL_TABLET | ORAL | Status: DC | PRN
  Administered 2013-04-08 (×3): 5 mg via ORAL
  Filled 2013-04-07 (×3): qty 1

## 2013-04-07 MED ORDER — PANTOPRAZOLE SODIUM 40 MG IV SOLR
40.0000 mg | Freq: Two times a day (BID) | INTRAVENOUS | Status: DC
  Administered 2013-04-07 – 2013-04-08 (×2): 40 mg via INTRAVENOUS
  Filled 2013-04-07 (×3): qty 40

## 2013-04-07 MED ORDER — ONDANSETRON HCL 4 MG/2ML IJ SOLN
4.0000 mg | Freq: Once | INTRAMUSCULAR | Status: AC
  Administered 2013-04-07: 4 mg via INTRAVENOUS

## 2013-04-07 MED ORDER — HYDROMORPHONE HCL PF 1 MG/ML IJ SOLN: 0.5000 mg | INTRAMUSCULAR | Status: DC | PRN

## 2013-04-07 MED ORDER — IOHEXOL 300 MG/ML  SOLN
50.0000 mL | Freq: Once | INTRAMUSCULAR | Status: AC | PRN
  Administered 2013-04-07: 50 mL via ORAL

## 2013-04-07 MED ORDER — ONDANSETRON HCL 4 MG/2ML IJ SOLN
4.0000 mg | Freq: Once | INTRAMUSCULAR | Status: AC
  Administered 2013-04-07: 4 mg via INTRAVENOUS
  Filled 2013-04-07: qty 2

## 2013-04-07 MED ORDER — IOHEXOL 300 MG/ML  SOLN
100.0000 mL | Freq: Once | INTRAMUSCULAR | Status: AC | PRN
  Administered 2013-04-07: 100 mL via INTRAVENOUS

## 2013-04-07 MED ORDER — ZOLPIDEM TARTRATE 5 MG PO TABS: 5.0000 mg | ORAL_TABLET | Freq: Every evening | ORAL | Status: DC | PRN

## 2013-04-07 MED ORDER — HYDROMORPHONE HCL PF 1 MG/ML IJ SOLN
1.0000 mg | INTRAMUSCULAR | Status: DC | PRN
  Administered 2013-04-07: 1 mg via INTRAVENOUS
  Filled 2013-04-07: qty 1

## 2013-04-07 MED ORDER — ONDANSETRON HCL 4 MG/2ML IJ SOLN
INTRAMUSCULAR | Status: AC
  Filled 2013-04-07: qty 2

## 2013-04-07 MED ORDER — ACETAMINOPHEN 325 MG PO TABS
650.0000 mg | ORAL_TABLET | Freq: Four times a day (QID) | ORAL | Status: DC | PRN
  Administered 2013-04-10: 650 mg via ORAL
  Filled 2013-04-07 (×2): qty 2

## 2013-04-07 MED ORDER — ONDANSETRON HCL 4 MG/2ML IJ SOLN: 4.0000 mg | Freq: Four times a day (QID) | INTRAMUSCULAR | Status: DC | PRN

## 2013-04-07 MED ORDER — SODIUM CHLORIDE 0.9 % IV SOLN
INTRAVENOUS | Status: DC
  Administered 2013-04-07 – 2013-04-11 (×8): via INTRAVENOUS

## 2013-04-07 MED ORDER — SODIUM CHLORIDE 0.9 % IV BOLUS (SEPSIS)
1000.0000 mL | Freq: Once | INTRAVENOUS | Status: AC
  Administered 2013-04-07: 1000 mL via INTRAVENOUS

## 2013-04-07 MED ORDER — ACETAMINOPHEN 650 MG RE SUPP: 650.0000 mg | Freq: Four times a day (QID) | RECTAL | Status: DC | PRN

## 2013-04-07 MED ORDER — ALUM & MAG HYDROXIDE-SIMETH 200-200-20 MG/5ML PO SUSP: 30.0000 mL | Freq: Four times a day (QID) | ORAL | Status: DC | PRN

## 2013-04-07 MED ORDER — HYDROMORPHONE HCL PF 1 MG/ML IJ SOLN
1.0000 mg | Freq: Once | INTRAMUSCULAR | Status: AC
  Administered 2013-04-07: 1 mg via INTRAVENOUS
  Filled 2013-04-07: qty 1

## 2013-04-07 MED ORDER — ONDANSETRON HCL 4 MG PO TABS: 4.0000 mg | ORAL_TABLET | Freq: Four times a day (QID) | ORAL | Status: DC | PRN

## 2013-04-07 MED ORDER — SODIUM CHLORIDE 0.9 % IV SOLN
INTRAVENOUS | Status: AC
  Administered 2013-04-07: 14:00:00 via INTRAVENOUS

## 2013-04-07 MED ORDER — ONDANSETRON HCL 4 MG/2ML IJ SOLN
4.0000 mg | Freq: Three times a day (TID) | INTRAMUSCULAR | Status: DC | PRN
  Filled 2013-04-07: qty 2

## 2013-04-07 NOTE — ED Notes (Signed)
Brittany Austin [phone] call if any change or if bed assignment determined

## 2013-04-07 NOTE — ED Notes (Signed)
Order for STAT zofran received from Dr. Manus Gunning.

## 2013-04-07 NOTE — ED Notes (Addendum)
Patient states she had a sudden onset of upper abdominal pain about 30 minutes pta.  Pt states she went to work this morning at 6:30 am and when she came home she ate a small amount of craisins all of a sudden the pain hit.  Denies any nausea.  Last BM was yesterday.  Patient states she has had some hard stools over several last several days and this morning she took miralax mixed in her coffee this morning at 0630, 600 mg Ibuprofen at 0900, prior to eating.

## 2013-04-07 NOTE — ED Notes (Signed)
Pt c/o nausea at this time.

## 2013-04-07 NOTE — H&P (Signed)
[facility] History and Physical  Brittany Austin NUU:[id number] DOB: Mar 06, 1976 DOA: 04/07/2013  Referring physician:  EDP PCP: Levon Hedger, MD  Specialists:   Chief Complaint: ABD Pain, Nausea and Vomiting  HPI: Brittany Austin is a 37 y.o. female with a history of ITP who presents to the [facility] ED with complaints of sudden onset of severe epigastric ABD pain and nausea and vomiting in the AM 07/01.  She denies having any fevers or chills, and denies having hematemesis, or diarrhea or hematochezia or passing melena.    She was evaluated and had an elevated lipase level of   243 on her labs and also had a CT scan of the ABD.  She was referred for medical admission at [facility].      Review of Systems: The patient denies anorexia, fever, chills, headaches, weight loss, vision loss, diplopia, dizziness, decreased hearing, rhinitis, hoarseness, chest pain, syncope, dyspnea on exertion, peripheral edema, balance deficits, cough, hemoptysis, diarrhea, constipation, hematemesis, melena, hematochezia, severe indigestion/heartburn, dysuria, hematuria, incontinence, muscle weakness, suspicious skin lesions, transient blindness, difficulty walking, depression, unusual weight change, abnormal bleeding, enlarged lymph nodes, angioedema, and breast masses.    Past Medical History  Diagnosis Date  . ITP (idiopathic thrombocytopenic purpura)   . Hearing loss of both ears   . Kidney stones   . Blood transfusion   . Cysts     scalp and left upper arm  . Rectal bleeding   . Blood in stool     sometimes  . Rectal pain     Past Surgical History  Procedure Laterality Date  . Novasure ablation  2009  . Tubal ligation    . Cholecystectomy  2009  . Cesarean section  1999, 2001    x's 2    Prior to Admission medications   Medication Sig Start Date End Date Taking? Authorizing Provider  Cyanocobalamin (VITAMIN B-12 CR PO) Take 1 tablet by mouth daily.   Yes Historical Provider, MD   Multiple Vitamin (MULTIVITAMIN) tablet Take 1 tablet by mouth daily.   Yes Historical Provider, MD  polyethylene glycol (MIRALAX / GLYCOLAX) packet Take 17 g by mouth daily as needed.   Yes Historical Provider, MD  AMBULATORY NON FORMULARY MEDICATION Apply 1 application topically 2 (two) times daily. Medication Name: Diltiazem 2% gel apply small amount (pea-sized) to anal area 11/06/11   Iva Boop, MD  fluconazole (DIFLUCAN) 150 MG tablet Take 1 tablet (150 mg total) by mouth once. 03/19/13   Allie Bossier, MD  nitrofurantoin, macrocrystal-monohydrate, (MACROBID) 100 MG capsule Take 100 mg by mouth 2 (two) times daily.    Historical Provider, MD  NON FORMULARY Pine Nut Oil  1 teaspoon 1-2 times qd    Historical Provider, MD  phenazopyridine (PYRIDIUM) 100 MG tablet Take 100 mg by mouth 3 (three) times daily as needed for pain.    Historical Provider, MD    No Known Allergies  Social History:  reports that she has never smoked. She has never used smokeless tobacco. She reports that she does not drink alcohol or use illicit drugs.     Family History  Problem Relation Age of Onset  . Arthritis    . Colon cancer Paternal Uncle   . Cancer Paternal Uncle     colon  . Hyperlipidemia Mother   . Heart disease Mother   . Hypertension Mother   . Diabetes Mother   . Hyperlipidemia Father   . Heart disease Paternal Grandmother   .  Diabetes Paternal Grandmother   . Stroke Maternal Grandmother   . Diabetes Maternal Grandmother   . Other Maternal Grandmother     brain tumor  . Cancer Paternal Grandfather     unknown source  . Diabetes Sister   . Cancer Paternal Aunt     uterine     Physical Exam:  GEN:  Pleasant well developed  37 y.o. Caucasian female  examined  and in  Discomfort but no acute distress; cooperative with exam Filed Vitals:   04/07/13 1224 04/07/13 1542 04/07/13 1928 04/07/13 2100  BP: 111/69 109/65 112/69 115/74  Pulse:  82 74 73  Temp: 98.6 F (37 C)   98.2 F (36.8  C)  TempSrc: Oral   Oral  Resp: 18 16 16 20   Height:    4\' 11"  (1.499 m)  Weight:    60.6 kg (133 lb 9.6 oz)  SpO2: 99% 98% 98% 99%   Blood pressure 115/74, pulse 73, temperature 98.2 F (36.8 C), temperature source Oral, resp. rate 20, height 4\' 11"  (1.499 m), weight 60.6 kg (133 lb 9.6 oz), last menstrual period 04/07/2013, SpO2 99.00%. PSYCH: She is alert and oriented x4; does not appear anxious does not appear depressed; affect is normal HEENT: Normocephalic and Atraumatic, Mucous membranes pink; PERRLA; EOM intact; Fundi:  Benign;  No scleral icterus, Nares: Patent, Oropharynx: Clear, Fair Dentition, Neck:  FROM, no cervical lymphadenopathy nor thyromegaly or carotid bruit; no JVD; Breasts:: Not examined CHEST WALL: No tenderness CHEST: Normal respiration, clear to auscultation bilaterally HEART: Regular rate and rhythm; no murmurs rubs or gallops BACK: No kyphosis or scoliosis; no CVA tenderness ABDOMEN: Positive Bowel Sounds, soft  Mildly tender in the epigastrium, no masses, no organomegaly.   Rectal Exam: Not done EXTREMITIES: No cyanosis, clubbing or edema; no ulcerations. Genitalia: not examined PULSES: 2+ and symmetric SKIN: Normal hydration no rash or ulceration CNS: Cranial nerves 2-12 grossly intact no focal neurologic deficit    Labs on Admission:  Basic Metabolic Panel:  Recent Labs Lab 04/07/13 1045  NA 139  K 3.8  CL 100  CO2 27  GLUCOSE 104*  BUN 12  CREATININE 0.80  CALCIUM 10.1   Liver Function Tests:  Recent Labs Lab 04/07/13 1045  AST 62*  ALT 35  ALKPHOS 56  BILITOT 0.5  PROT 7.8  ALBUMIN 4.7    Recent Labs Lab 04/07/13 1045  LIPASE 296*   No results found for this basename: AMMONIA,  in the last 168 hours CBC:  Recent Labs Lab 04/07/13 1045  WBC 5.4  NEUTROABS 4.2  HGB 13.4  HCT 38.1  MCV 95.5  PLT 80*   Cardiac Enzymes: No results found for this basename: CKTOTAL, CKMB, CKMBINDEX, TROPONINI,  in the last 168  hours  BNP (last 3 results) No results found for this basename: PROBNP,  in the last 8760 hours CBG: No results found for this basename: GLUCAP,  in the last 168 hours  Radiological Exams on Admission: Ct Abdomen Pelvis W Contrast  04/07/2013   *RADIOLOGY REPORT*  Clinical Data: Right upper quadrant pain with nausea.  CT ABDOMEN AND PELVIS WITH CONTRAST  Technique:  Multidetector CT imaging of the abdomen and pelvis was performed following the standard protocol during bolus administration of intravenous contrast.  Contrast: 50mL OMNIPAQUE IOHEXOL 300 MG/ML  SOLN, OMNIPAQUE IOHEXOL 300 MG/ML  SOLN  Comparison: None.  Findings: The patient has periportal edema in the liver.  Liver is otherwise normal.  Gallbladder has been  removed.  No dilated bile ducts.  The pancreas appears normal.  Spleen, adrenal glands, kidneys, and bowel appear normal including the terminal ileum and appendix.  Uterus and ovaries are normal. No osseous abnormality.  No free air or free fluid or adenopathy.  IMPRESSION: Periportal edema, nonspecific.  Otherwise, normal exam.   Original Report Authenticated By: Francene Boyers, M.D.      Assessment/Plan Principal Problem:   Pancreatitis Active Problems:   Abdominal pain, epigastric    1.   Acute Pancreatitis- Supportive Care with Bowel Rest, Pain Control PRN and Anti-Emetics PRN.   IVFs for maintenance.  GI consult in AM 07/02.    2.   ABD Pain- Due to #1.  PAin control PRn with IV Dilaudid.    3.   ITP-  Monitor platelets, currently 80.     4.  DVT prophylaxis with SCDs.       Code Status:   FULL CODE Family Communication:   Mother at Bedside Disposition Plan:  Return to Home on Discharge     Time spent: 26 Minutes  Ron Parker [facility] Pager [phone]  If 7PM-7AM, please contact night-coverage [email or web address] Password [facility] 04/07/2013, 10:33 PM

## 2013-04-07 NOTE — ED Provider Notes (Signed)
History    CSN: [id number] Arrival date & time 04/07/13  1001  First MD Initiated Contact with Patient 04/07/13 1024     Chief Complaint  Patient presents with  . Abdominal Pain   (Consider location/radiation/quality/duration/timing/severity/associated sxs/prior Treatment) HPI  37 y.o. Female complaining epigastric pain sudden onset began this am just pta.  Pain is squeezing and radiates to the right and through to the back.  No nausea, vomiting, no diarrhea, some hard stools.  BM last night hard but normal for her.  Took miralax this am and ibuprofen.  History of itp 1999 and was on prednisone, no further problems.  G2P2 LMP period began today,normal time.  Pain increased with deep breath and talking.  Better with being still.  Denies prior similar symptoms.  PMD Dr. Constance Goltz.  GI  Past Medical History  Diagnosis Date  . ITP (idiopathic thrombocytopenic purpura)   . Hearing loss of both ears   . Kidney stones   . Blood transfusion   . Cysts     scalp and left upper arm  . Rectal bleeding   . Blood in stool     sometimes  . Rectal pain    Past Surgical History  Procedure Laterality Date  . Novasure ablation  2009  . Tubal ligation    . Cholecystectomy  2009  . Cesarean section  1999, 2001    x's 2   Family History  Problem Relation Age of Onset  . Arthritis    . Colon cancer Paternal Uncle   . Cancer Paternal Uncle     colon  . Hyperlipidemia Mother   . Heart disease Mother   . Hypertension Mother   . Diabetes Mother   . Hyperlipidemia Father   . Heart disease Paternal Grandmother   . Diabetes Paternal Grandmother   . Stroke Maternal Grandmother   . Diabetes Maternal Grandmother   . Other Maternal Grandmother     brain tumor  . Cancer Paternal Grandfather     unknown source  . Diabetes Sister   . Cancer Paternal Aunt     uterine   History  Substance Use Topics  . Smoking status: Never Smoker   . Smokeless tobacco: Never Used  . Alcohol Use: No   OB  History   Grav Para Term Preterm Abortions TAB SAB Ect Mult Living   2 2 2       2      Review of Systems  All other systems reviewed and are negative.    Allergies  Review of patient's allergies indicates no known allergies.  Home Medications   Current Outpatient Rx  Name  Route  Sig  Dispense  Refill  . AMBULATORY NON FORMULARY MEDICATION   Topical   Apply 1 application topically 2 (two) times daily. Medication Name: Diltiazem 2% gel apply small amount (pea-sized) to anal area   30 g   0   . Cyanocobalamin (VITAMIN B-12 CR PO)   Oral   Take 1 tablet by mouth daily.         . fluconazole (DIFLUCAN) 150 MG tablet   Oral   Take 1 tablet (150 mg total) by mouth once.   1 tablet   0   . Multiple Vitamin (MULTIVITAMIN) tablet   Oral   Take 1 tablet by mouth daily.         . nitrofurantoin, macrocrystal-monohydrate, (MACROBID) 100 MG capsule   Oral   Take 100 mg by mouth 2 (two) times  daily.         . NON FORMULARY      Pine Nut Oil  1 teaspoon 1-2 times qd         . phenazopyridine (PYRIDIUM) 100 MG tablet   Oral   Take 100 mg by mouth 3 (three) times daily as needed for pain.         . polyethylene glycol powder (GLYCOLAX/MIRALAX) powder   Oral   Take 17 g by mouth daily. Can take up to twice a day as needed   255 g   0    BP 131/83  Pulse 61  Temp(Src) 97.9 F (36.6 C) (Oral)  Resp 22  Ht 4\' 11"  (1.499 m)  Wt 130 lb (58.968 kg)  BMI 26.24 kg/m2  SpO2 100%  LMP 04/07/2013 Physical Exam  Nursing note and vitals reviewed. Constitutional: She is oriented to person, place, and time. She appears well-developed and well-nourished.  HENT:  Head: Normocephalic and atraumatic.  Right Ear: External ear normal.  Left Ear: External ear normal.  Nose: Nose normal.  Mouth/Throat: Oropharynx is clear and moist.  Eyes: Conjunctivae and EOM are normal. Pupils are equal, round, and reactive to light.  Neck: Normal range of motion. Neck supple.   Cardiovascular: Normal rate, regular rhythm, normal heart sounds and intact distal pulses.   Pulmonary/Chest: Effort normal and breath sounds normal.  Abdominal: Soft. Bowel sounds are normal. There is tenderness.  ruq and epigastrium moderate ttp  Musculoskeletal: Normal range of motion.  Neurological: She is alert and oriented to person, place, and time. She has normal reflexes.  Skin: Skin is warm and dry.  Psychiatric: She has a normal mood and affect. Her behavior is normal. Thought content normal.    ED Course  Procedures (including critical care time) Labs Reviewed - No data to display No results found. No diagnosis found.  MDM   Results for orders placed during the hospital encounter of 04/07/13  CBC WITH DIFFERENTIAL      Result Value Range   WBC 5.4  4.0 - 10.5 K/uL   RBC 3.99  3.87 - 5.11 MIL/uL   Hemoglobin 13.4  12.0 - 15.0 g/dL   HCT 40.9  81.1 - 91.4 %   MCV 95.5  78.0 - 100.0 fL   MCH 33.6  26.0 - 34.0 pg   MCHC 35.2  30.0 - 36.0 g/dL   RDW 78.2  95.6 - 21.3 %   Platelets 80 (*) 150 - 400 K/uL   Neutrophils Relative % 78 (*) 43 - 77 %   Lymphocytes Relative 13  12 - 46 %   Monocytes Relative 7  3 - 12 %   Eosinophils Relative 2  0 - 5 %   Basophils Relative 0  0 - 1 %   Neutro Abs 4.2  1.7 - 7.7 K/uL   Lymphs Abs 0.7  0.7 - 4.0 K/uL   Monocytes Absolute 0.4  0.1 - 1.0 K/uL   Eosinophils Absolute 0.1  0.0 - 0.7 K/uL   Basophils Absolute 0.0  0.0 - 0.1 K/uL   Smear Review PLATELET COUNT CONFIRMED BY SMEAR    COMPREHENSIVE METABOLIC PANEL      Result Value Range   Sodium 139  135 - 145 mEq/L   Potassium 3.8  3.5 - 5.1 mEq/L   Chloride 100  96 - 112 mEq/L   CO2 27  19 - 32 mEq/L   Glucose, Bld 104 (*) 70 - 99 mg/dL  BUN 12  6 - 23 mg/dL   Creatinine, Ser 1.91  0.50 - 1.10 mg/dL   Calcium 47.8  8.4 - 29.5 mg/dL   Total Protein 7.8  6.0 - 8.3 g/dL   Albumin 4.7  3.5 - 5.2 g/dL   AST 62 (*) 0 - 37 U/L   ALT 35  0 - 35 U/L   Alkaline Phosphatase 56   39 - 117 U/L   Total Bilirubin 0.5  0.3 - 1.2 mg/dL   GFR calc non Af Amer >90  >90 mL/min   GFR calc Af Amer >90  >90 mL/min  LIPASE, BLOOD      Result Value Range   Lipase 296 (*) 11 - 59 U/L  URINALYSIS, ROUTINE W REFLEX MICROSCOPIC      Result Value Range   Color, Urine YELLOW  YELLOW   APPearance TURBID (*) CLEAR   Specific Gravity, Urine 1.020  1.005 - 1.030   pH 7.5  5.0 - 8.0   Glucose, UA NEGATIVE  NEGATIVE mg/dL   Hgb urine dipstick NEGATIVE  NEGATIVE   Bilirubin Urine NEGATIVE  NEGATIVE   Ketones, ur NEGATIVE  NEGATIVE mg/dL   Protein, ur NEGATIVE  NEGATIVE mg/dL   Urobilinogen, UA 1.0  0.0 - 1.0 mg/dL   Nitrite NEGATIVE  NEGATIVE   Leukocytes, UA NEGATIVE  NEGATIVE  PREGNANCY, URINE      Result Value Range   Preg Test, Ur NEGATIVE  NEGATIVE  URINE MICROSCOPIC-ADD ON      Result Value Range   Squamous Epithelial / LPF RARE  RARE   WBC, UA 0-2  <3 WBC/hpf   RBC / HPF 0-2  <3 RBC/hpf   Bacteria, UA FEW (*) RARE   Casts GRANULAR CAST (*) NEGATIVE   Urine-Other MUCOUS PRESENT     Ct Abdomen Pelvis W Contrast  04/07/2013   *RADIOLOGY REPORT*  Clinical Data: Right upper quadrant pain with nausea.  CT ABDOMEN AND PELVIS WITH CONTRAST  Technique:  Multidetector CT imaging of the abdomen and pelvis was performed following the standard protocol during bolus administration of intravenous contrast.  Contrast: 50mL OMNIPAQUE IOHEXOL 300 MG/ML  SOLN, OMNIPAQUE IOHEXOL 300 MG/ML  SOLN  Comparison: None.  Findings: The patient has periportal edema in the liver.  Liver is otherwise normal.  Gallbladder has been removed.  No dilated bile ducts.  The pancreas appears normal.  Spleen, adrenal glands, kidneys, and bowel appear normal including the terminal ileum and appendix.  Uterus and ovaries are normal. No osseous abnormality.  No free air or free fluid or adenopathy.  IMPRESSION: Periportal edema, nonspecific.  Otherwise, normal exam.   Original Report Authenticated By: Francene Boyers, M.D.    Labs and exam c.w. Pancreatitis.  CT without obvious cause of pancreatitis.  Patient continues to have pain and emesis but remains hemdynamically stable.  Patient has had a cholecystectomy and denies alcohol use.  Her ducts do not appear dilated on ct.  Patient needs to have [place] and possible gi consult.    ITP- Platelets 80,000. She has no evidence of active bleeding.  She has been seen by Dr.Ennever in the past and told she did not need treatment at that time.    Discussed with Dr. Suanne Marker.  Patient continues to have pain and vomiting and requiring antiemetics.  Patient remains hemodynamically stable.   Hilario Quarry, MD 04/07/13 [phone]

## 2013-04-07 NOTE — ED Notes (Signed)
[facility] here for transport now

## 2013-04-08 ENCOUNTER — Encounter (HOSPITAL_COMMUNITY): Payer: Self-pay | Admitting: Physician Assistant

## 2013-04-08 DIAGNOSIS — N946 Dysmenorrhea, unspecified: Secondary | ICD-10-CM

## 2013-04-08 LAB — BASIC METABOLIC PANEL
CO2: 23 mEq/L (ref 19–32)
Calcium: 8.5 mg/dL (ref 8.4–10.5)
Creatinine, Ser: 0.74 mg/dL (ref 0.50–1.10)
GFR calc Af Amer: >90 mL/min (ref >90)
GFR calc non Af Amer: >90 mL/min (ref >90)
Sodium: 138 mEq/L (ref 135–145)

## 2013-04-08 LAB — LIPASE, BLOOD: Lipase: 36 U/L (ref 11–59)

## 2013-04-08 LAB — CBC
Platelets: 63 10*3/uL — ABNORMAL LOW (ref 150–400)
RBC: 3.42 MIL/uL — ABNORMAL LOW (ref 3.87–5.11)
RDW: 12.2 % (ref 11.5–15.5)
WBC: 3.8 10*3/uL — ABNORMAL LOW (ref 4.0–10.5)

## 2013-04-08 MED ORDER — PANTOPRAZOLE SODIUM 40 MG PO TBEC
40.0000 mg | DELAYED_RELEASE_TABLET | Freq: Every day | ORAL | Status: DC
  Administered 2013-04-09 – 2013-04-11 (×3): 40 mg via ORAL
  Filled 2013-04-08 (×4): qty 1

## 2013-04-08 NOTE — Consult Note (Signed)
[facility] Consult: 12:16 PM 04/08/2013   Referring Provider: Dr Arthor Captain  Primary Care Physician:  Levon Hedger, MD at med center [facility]  Primary Gastroenterologist:  Office visit to Dr Leone Payor in 10/2011.    Reason for Consultation:  Abdominal pain, lipase 296, no pancreatitis on CT  HPI: Brittany Austin is a non-obese 37 y.o. female.  Hx ITP, hearing loss.   S/P cholecystectomy for biliary dyskinesia in 2009.  Diagnoses made on basis of positive HIDAscan she did not have an ERCP nor pancreatitis.   This relieved her abdominal pain. Hx rectal bleeding attributed by Dr Leone Payor to internal hemorrhoids and possible anal fissure by office anoscopy in 10/2011.  Treatment with Miralax,  Anusol HC and Diltiazem gel.   Treated with Macrodantin for UTI by urgent care at [facility] med center in last week of April  Treated with Diflucan starting 6/12 for yeast found on PAP smear of 03/11/13  Admitted 7/1 from ED with acute onset of epigastric to RUQ pain yesterday morning.  Rapidly accelerated and very intense.  Brought to ED.  Nausea eventually but only vomited after she returned from CT scanner mid day.   Labs with Lipase 296, 36 on recheck today.  AST was 62 yesterday, otherwise normal LFTs. Note normal LFTs on annual screening in April 2014.  Her abdominal pain is improved, has not used Zofran or Dilaudid in the last 16 hours.   Lost 65 # intentionally 10/2010 - 02/2012.  Has maintained the weight loss.  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  Drinking water and belching helps this.  Takes Miralax prn for constipation and hard stools.  Does not see scant blood per rectum that often. Takes Advil 600 mg daily prn.  Has done this 3  times since this past weekend.   Advil did not relieve her epigastric pain.      Past Medical History  Diagnosis Date  . ITP (idiopathic thrombocytopenic purpura)   . Hearing loss of both ears   . Kidney stones   . Blood transfusion   . Cysts     scalp and left upper arm  . Rectal bleeding   . Blood in stool     sometimes  . Rectal pain     Past Surgical History  Procedure Laterality Date  . Novasure ablation  2009  . Tubal ligation    . Cholecystectomy  2009  . Cesarean section  1999, 2001    x's 2    Prior to Admission medications   Medication Sig Start Date End Date Taking? Authorizing Provider  Cyanocobalamin (VITAMIN B-12 CR PO) Take 1 tablet by mouth daily.   Yes Historical Provider, MD  Multiple Vitamin (MULTIVITAMIN) tablet Take 1 tablet by mouth daily.   Yes Historical Provider, MD  polyethylene glycol (MIRALAX / GLYCOLAX) packet Take 17 g by mouth daily as needed.   Yes Historical Provider, MD  AMBULATORY NON FORMULARY MEDICATION Apply 1 application topically 2 (two) times daily. Medication Name: Diltiazem 2% gel apply small amount (pea-sized) to anal area 11/06/11   Iva Boop, MD  fluconazole (DIFLUCAN) 150 MG tablet Take 1 tablet (150 mg total) by mouth once. 03/19/13   Allie Bossier, MD  nitrofurantoin, macrocrystal-monohydrate, (MACROBID) 100 MG capsule Take 100 mg  by mouth 2 (two) times daily.    Historical Provider, MD  NON FORMULARY Pine Nut Oil  1 teaspoon 1-2 times qd    Historical Provider, MD  phenazopyridine (PYRIDIUM) 100 MG tablet Take 100 mg by mouth 3 (three) times daily as needed for pain.    Historical Provider, MD    Scheduled Meds: . pantoprazole (PROTONIX) IV  40 mg Intravenous Q12H   Infusions: . sodium chloride 100 mL/hr at 04/08/13 0836   PRN Meds: acetaminophen, acetaminophen, alum & mag hydroxide-simeth, HYDROmorphone (DILAUDID) injection, ondansetron (ZOFRAN) IV, ondansetron, oxyCODONE, zolpidem   Allergies as of 04/07/2013  . (No  Known Allergies)    Family History  Problem Relation Age of Onset  . Arthritis    . Colon cancer Paternal Uncle   . Cancer Paternal Uncle     colon  . Hyperlipidemia Mother   . Heart disease Mother   . Hypertension Mother   . Diabetes Mother   . Hyperlipidemia Father   . Heart disease Paternal Grandmother   . Diabetes Paternal Grandmother   . Stroke Maternal Grandmother   . Diabetes Maternal Grandmother   . Other Maternal Grandmother     brain tumor  . Cancer Paternal Grandfather     unknown source  . Diabetes Sister   . Cancer Paternal Aunt     uterine    History   Social History  . Marital Status: Lives with her female partner    Spouse Name: N/A    Number of Children: 2  . Years of Education:    Occupational History  . Engineer, petroleum and bus driver [employer]   Social History Main Topics  . Smoking status: Never Smoker   . Smokeless tobacco: Never Used  . Alcohol Use: No  . Drug Use: No  . Sexually Active: Not Currently     REVIEW OF SYSTEMS: Constitutional:  No physical limitations.  No fatigue ENT:  Hearing aids bil Pulm:  No resp issues or cough CV:  No palps or CP GU:  No urinary sxs. GI:  Per HPI.  Appetite generally good, follows low fat diet Heme:  No issues with low blood counts.    Transfusions:  none Neuro:  failrly regular frontal headaches Derm:  No rash or itching Endocrine:  No excessive thirst or urination Immunization:  Not queried Travel:  none   PHYSICAL EXAM: Vital signs in last 24 hours: Temp:  [98 F (36.7 C)-98.6 F (37 C)] 98 F (36.7 C) (07/02 1108) Pulse Rate:  [69-82] 70 (07/02 1108) Resp:  [16-20] 20 (07/02 1108) BP: (105-115)/(65-74) 105/70 mmHg (07/02 1108) SpO2:  [98 %-100 %] 100 % (07/02 1108) Weight:  [60.6 kg (133 lb 9.6 oz)] 60.6 kg (133 lb 9.6 oz) (07/01 2100)  General: well appearing WF.  comfortable Head:  No asymmetry or swelling  Eyes:  No icterus or pallor Ears:  Hearing aids in  place  Nose:  No discharge Mouth:  Clear, moist oral MM Neck:  No JVD, Bruits or TMG Lungs:  Clear bil.  No cough Heart: RRR.  No MRG Abdomen:  Soft, Minimal if any tendrness at RUQ/epigastrum.   Rectal: deferred   Musc/Skeltl: no joint swelling or deformity Extremities:  No CCE  Neurologic:  No tremor, no limb weakness.   Skin:  No rash, no sores Tattoos:  None seen Nodes:  No cervical adenopathy   Psych:  Pleasant, cooperative, relaxed.   Intake/Output from previous day: 07/01 0701 - 07/02  0700 In: 2373.3 [P.O.:650; I.V.:1723.3] Out: 450 [Urine:250; Emesis/NG output:200] Intake/Output this shift: Total I/O In: 60 [P.O.:60] Out: -   LAB RESULTS:  Recent Labs  04/07/13 1045 04/08/13 0654  WBC 5.4 3.8*  HGB 13.4 11.1*  HCT 38.1 32.4*  PLT 80* 63*   BMET Lab Results  Component Value Date   NA 138 04/08/2013   NA 139 04/07/2013   NA 139 01/29/2013   K 3.6 04/08/2013   K 3.8 04/07/2013   K 4.4 01/29/2013   CL 105 04/08/2013   CL 100 04/07/2013   CL 103 01/29/2013   CO2 23 04/08/2013   CO2 27 04/07/2013   CO2 29 01/29/2013   GLUCOSE 75 04/08/2013   GLUCOSE 104* 04/07/2013   GLUCOSE 85 01/29/2013   BUN 8 04/08/2013   BUN 12 04/07/2013   BUN 13 01/29/2013   CREATININE 0.74 04/08/2013   CREATININE 0.80 04/07/2013   CREATININE 0.75 01/29/2013   CALCIUM 8.5 04/08/2013   CALCIUM 10.1 04/07/2013   CALCIUM 10.0 01/29/2013   LFT  Recent Labs  04/07/13 1045  PROT 7.8  ALBUMIN 4.7  AST 62*  ALT 35  ALKPHOS 56  BILITOT 0.5   Lipase  7/1:   296 7/2    36  PT/INR No results found for this basename: INR, PROTIME     RADIOLOGY STUDIES: Ct Abdomen Pelvis W Contrast 04/07/2013     Findings: The patient has periportal edema in the liver.  Liver is otherwise normal.  Gallbladder has been removed.  No dilated bile ducts.  The pancreas appears normal.  Spleen, adrenal glands, kidneys, and bowel appear normal including the terminal ileum and appendix.  Uterus and ovaries are normal. No osseous  abnormality.  No free air or free fluid or adenopathy.  IMPRESSION: Periportal edema, nonspecific.  Otherwise, normal exam.   Original Report Authenticated By: Francene Boyers, M.D.    ENDOSCOPIC STUDIES: None in Epic She says no hx of EGD, colonoscopy, ERCP or flex  IMPRESSION: *  Acute pancreatitis, mild.  Lipase rapidly normalized. AST mildly elevated. Periportal edema but normal biliary ducts on CT scan.  *  S/p 2009 cholecystectomy for biliary dyskinesia, abdominal pain.  In [place] *  Intermittent rectal bleeding.  Int rrhoids and possible anal fissure on 10/2011 office anoscopy. *  Hx ITP. Note platelets in 60s and 80s. Baseline is 80s to low 100s.   PLAN: *  Per Dr Rhea Belton.    LOS: 1 day   Jennye Moccasin  04/08/2013, 12:16 PM Pager: [phone]

## 2013-04-08 NOTE — Consult Note (Signed)
Patient seen, examined, and I agree with the above documentation, including the assessment and plan. On speaking with the patient, her clinical presentation is that of biliary colic, possibly pancreatitis. Am not convinced that she truly had pancreatitis. Lipase was elevated, however it normalized in one day and CT scan showed no evidence of pancreatitis. She has had smaller, less severe attacks of similar symptoms over the last few months to even a year. Interestingly, her AST and ALT bumped from her baseline.  ALT increased to fold, AST increased fourfold. This raises the question of sphincter of oddi dysfunction leading to a biliary pain episode. She has had her gallbladder out, which makes sphincter of oddi dysfunction a bit more likely/possible I discussed this with her in detail, other options include pancreatitis alone, gastroduodenitis. For now I recommend supportive care. I would like to see what her hepatic function panel looks like tomorrow.  I recommend hepatic function panel 8-12 hours after any other painful attacks  Assuming she improves quickly as she has so far, when she is discharged, she can leave with pain medication should she develop another painful episode She should followup with Dr. Leone Payor to further discuss SOD and consider therapeutic sphincterotomy  I would like her to do a 30 day trial of PPI to see if this benefits her or not  Discussed at length with both the patient and her partner, who was at the bedside

## 2013-04-08 NOTE — Progress Notes (Signed)
[facility] PROGRESS NOTE  Brittany Austin JXB:[id number] DOB: [birth year] DOA: 04/07/2013 PCP: Levon Hedger, MD  HPI/Subjective: Pain is improved since yesterday, today is only 1/10  Assessment/Plan:  Abdominal pain -Associated with nausea and vomiting, pain was 15/10 yesterday, improved to 1-2/10 today. -Patient had elevated lipase of 296 yesterday, lipase is normal today. -CT of abdomen and pelvis did not show any evidence of pancreatitis. -Periportal edema, no other abnormalities to explain severe abdominal pain. -Patient wants GI to evaluate her, start clear liquids.  Abnormal LFTs -Patient has slightly elevated AST of 62, the rest of LFTs normal. -CT scan of abdomen pelvis showed periportal edema without any other abnormalities. -Status post cholecystectomy. -Patient reports an intentional weight loss of about 60 pounds in the last year.    History of ITP -Platelets 63, per patient she was seeing Dr. Myna Hidalgo who recommended no treatment recently.  Code Status: Full Code Family Communication: Plan d/w the patient with her partner at bedside. Disposition Plan: Remains   Consultants:  Consult GI  Procedures:  None  Antibiotics:  None  Objective: Filed Vitals:   04/07/13 1928 04/07/13 2100 04/08/13 0500 04/08/13 1108  BP: 112/69 115/74 113/69 105/70  Pulse: 74 73 69 70  Temp:  98.2 F (36.8 C) 98.3 F (36.8 C) 98 F (36.7 C)  TempSrc:  Oral Oral Oral  Resp: 16 20 20 20   Height:  4\' 11"  (1.499 m)    Weight:  60.6 kg (133 lb 9.6 oz)    SpO2: 98% 99% 99% 100%    Intake/Output Summary (Last 24 hours) at 04/08/13 1153 Last data filed at 04/08/13 1107  Gross per 24 hour  Intake 2433.33 ml  Output    450 ml  Net 1983.33 ml   Filed Weights   04/07/13 1005 04/07/13 2100  Weight: 58.968 kg (130 lb) 60.6 kg (133 lb 9.6 oz)    Exam: General: Alert and awake, oriented x3, not in any acute distress. HEENT: anicteric sclera, pupils reactive to light  and accommodation, EOMI CVS: S1-S2 clear, no murmur rubs or gallops Chest: clear to auscultation bilaterally, no wheezing, rales or rhonchi Abdomen: soft nontender, nondistended, normal bowel sounds, no organomegaly Extremities: no cyanosis, clubbing or edema noted bilaterally Neuro: Cranial nerves II-XII intact, no focal neurological deficits  Data Reviewed: Basic Metabolic Panel:  Recent Labs Lab 04/07/13 1045 04/08/13 0654  NA 139 138  K 3.8 3.6  CL 100 105  CO2 27 23  GLUCOSE 104* 75  BUN 12 8  CREATININE 0.80 0.74  CALCIUM 10.1 8.5   Liver Function Tests:  Recent Labs Lab 04/07/13 1045  AST 62*  ALT 35  ALKPHOS 56  BILITOT 0.5  PROT 7.8  ALBUMIN 4.7    Recent Labs Lab 04/07/13 1045 04/08/13 0654  LIPASE 296* 36   No results found for this basename: AMMONIA,  in the last 168 hours CBC:  Recent Labs Lab 04/07/13 1045 04/08/13 0654  WBC 5.4 3.8*  NEUTROABS 4.2  --   HGB 13.4 11.1*  HCT 38.1 32.4*  MCV 95.5 94.7  PLT 80* 63*   Cardiac Enzymes: No results found for this basename: CKTOTAL, CKMB, CKMBINDEX, TROPONINI,  in the last 168 hours BNP (last 3 results) No results found for this basename: PROBNP,  in the last 8760 hours CBG: No results found for this basename: GLUCAP,  in the last 168 hours  No results found for this or any previous visit (from the past 240 hour(s)).   Studies:  Ct Abdomen Pelvis W Contrast  04/07/2013   *RADIOLOGY REPORT*  Clinical Data: Right upper quadrant pain with nausea.  CT ABDOMEN AND PELVIS WITH CONTRAST  Technique:  Multidetector CT imaging of the abdomen and pelvis was performed following the standard protocol during bolus administration of intravenous contrast.  Contrast: 50mL OMNIPAQUE IOHEXOL 300 MG/ML  SOLN, OMNIPAQUE IOHEXOL 300 MG/ML  SOLN  Comparison: None.  Findings: The patient has periportal edema in the liver.  Liver is otherwise normal.  Gallbladder has been removed.  No dilated bile ducts.  The  pancreas appears normal.  Spleen, adrenal glands, kidneys, and bowel appear normal including the terminal ileum and appendix.  Uterus and ovaries are normal. No osseous abnormality.  No free air or free fluid or adenopathy.  IMPRESSION: Periportal edema, nonspecific.  Otherwise, normal exam.   Original Report Authenticated By: Francene Boyers, M.D.    Scheduled Meds: . pantoprazole (PROTONIX) IV  40 mg Intravenous Q12H   Continuous Infusions: . sodium chloride 100 mL/hr at 04/08/13 0836    Principal Problem:   Pancreatitis Active Problems:   Abdominal pain, epigastric    Time spent: 35 minutes    [facility]  [facility] Pager [phone]. If 7PM-7AM, please contact night-coverage at [email or web address], password [facility] 04/08/2013, 11:53 AM  LOS: 1 day

## 2013-04-08 NOTE — Care Management Note (Signed)
    Page 1 of 1   04/28/2013     4:23:37 PM   CARE MANAGEMENT NOTE 04/28/2013  Patient:  Brittany Austin, Brittany Austin   Account Number:  [id number]  Date Initiated:  04/08/2013  Documentation initiated by:  Letha Cape  Subjective/Objective Assessment:   dx pancreatitis  admit- lives with partner. pta indep.     Action/Plan:   Anticipated DC Date:  04/10/2013   Anticipated DC Plan:  HOME/SELF CARE      DC Planning Services  CM consult      Choice offered to / List presented to:             Status of service:  Completed, signed off Medicare Important Message given?   (If response is "NO", the following Medicare IM given date fields will be blank) Date Medicare IM given:   Date Additional Medicare IM given:    Discharge Disposition:  HOME/SELF CARE  Per UR Regulation:  Reviewed for med. necessity/level of care/duration of stay  If discussed at Long Length of Stay Meetings, dates discussed:    Comments:  04/08/13 16:26 Letha Cape RN, BSN [phone] patient lives with partner, pta indep. NCM will continue to follow for dc needs.

## 2013-04-09 ENCOUNTER — Inpatient Hospital Stay (HOSPITAL_COMMUNITY): Payer: BC Managed Care – PPO

## 2013-04-09 DIAGNOSIS — D696 Thrombocytopenia, unspecified: Secondary | ICD-10-CM

## 2013-04-09 DIAGNOSIS — R7989 Other specified abnormal findings of blood chemistry: Secondary | ICD-10-CM

## 2013-04-09 DIAGNOSIS — K8020 Calculus of gallbladder without cholecystitis without obstruction: Secondary | ICD-10-CM

## 2013-04-09 DIAGNOSIS — K8021 Calculus of gallbladder without cholecystitis with obstruction: Secondary | ICD-10-CM

## 2013-04-09 LAB — CBC
HCT: 32.3 % — ABNORMAL LOW (ref 36.0–46.0)
MCH: 32.7 pg (ref 26.0–34.0)
MCV: 96.1 fL (ref 78.0–100.0)
Platelets: 58 10*3/uL — ABNORMAL LOW (ref 150–400)
RDW: 12.3 % (ref 11.5–15.5)
WBC: 3.7 10*3/uL — ABNORMAL LOW (ref 4.0–10.5)

## 2013-04-09 LAB — COMPREHENSIVE METABOLIC PANEL
AST: 190 U/L — ABNORMAL HIGH (ref 0–37)
Albumin: 3.6 g/dL (ref 3.5–5.2)
Alkaline Phosphatase: 62 U/L (ref 39–117)
Chloride: 106 mEq/L (ref 96–112)
Potassium: 3.6 mEq/L (ref 3.5–5.1)
Sodium: 140 mEq/L (ref 135–145)
Total Bilirubin: 0.7 mg/dL (ref 0.3–1.2)
Total Protein: 5.9 g/dL — ABNORMAL LOW (ref 6.0–8.3)

## 2013-04-09 MED ORDER — GADOBENATE DIMEGLUMINE 529 MG/ML IV SOLN
15.0000 mL | Freq: Once | INTRAVENOUS | Status: AC
  Administered 2013-04-09: 15 mL via INTRAVENOUS

## 2013-04-09 MED ORDER — IBUPROFEN 600 MG PO TABS
600.0000 mg | ORAL_TABLET | Freq: Once | ORAL | Status: AC
  Administered 2013-04-09: 600 mg via ORAL
  Filled 2013-04-09: qty 1

## 2013-04-09 NOTE — Progress Notes (Signed)
Patient seen, examined, and I agree with the above documentation, including the assessment and plan. I do not think she has pancreatitis.  MRCP done today does NOT show biliary ductal dilation or any evidence of retained stone/obstruction.   MRI abd without evidence of pancreatitis. AST/ALT are up in the 200-300 range, all hepatocellular component, with very normal bili and alk phos Await acute hep panel, also ana/igg to exclude AIH MRI raises question of celiac artery compression, but celiac artery compression syndrome is rather rare.   PLTs lower today and rec following closely given history of ITP.  She has seen Dr. Myna Hidalgo in the past and heme can be called if plts continue to decline.

## 2013-04-09 NOTE — Progress Notes (Signed)
PATIENT DETAILS Name: Brittany Austin Age: 37 y.o. Sex: female Date of Birth: 08-Nov-1975 Admit Date: 04/07/2013 Admitting Physician Adeline Joselyn Glassman, MD ZOX:[id number],[email or web address], MD  Subjective: Headaches last night, better with ibuprofen. Still complaining of some abdominal pain. Some nausea this morning as well.  Assessment/Plan: Abdominal pain - Not sure exactly what the etiology is- GI suspecting  sphincter of Oddi dysfunction, lipase elevated on admission-pancreatitis definite other differential-however no changes of pancreatitis seen on recent CT scan of the abdomen - Spoke with GI-current recommendations for further evaluation with a MRCP, hepatitis serology has also been ordered - Her abdomen is soft, with mild tenderness in the epigastric region without any rebound or rigidity - Is status post cholecystectomy in 2009 - Continue with supportive care - Recheck lipase in a.m.  Transaminitis - Her AST and ALT had risen significantly since 7/1 - ANA and acute hepatitis serology has been ordered  Immune thrombocytopenia - Platelets slowly creeping down, we'll continue to monitor for now- avoid aspirin or any other nonsteroidal anti-inflammatory medications - May need hematology inpatient evaluation if it continues to go down.  Disposition: Remain inpatient  DVT Prophylaxis: Prophylactic SCD's  Code Status: Full code   Family Communication None at bedside this am  Procedures:  None  CONSULTS:  GI   MEDICATIONS: Scheduled Meds: . pantoprazole  40 mg Oral Q0600   Continuous Infusions: . sodium chloride 100 mL/hr at 04/09/13 0431   PRN Meds:.acetaminophen, acetaminophen, alum & mag hydroxide-simeth, HYDROmorphone (DILAUDID) injection, ondansetron (ZOFRAN) IV, ondansetron, oxyCODONE, zolpidem  Antibiotics: Anti-infectives   None       PHYSICAL EXAM: Vital signs in last 24 hours: Filed Vitals:   04/09/13 0152 04/09/13 0519 04/09/13 0628 04/09/13 1041  BP:  113/72 117/77  104/69  Pulse: 68 61  73  Temp: 98.3 F (36.8 C) 98.3 F (36.8 C) 98.2 F (36.8 C) 98.3 F (36.8 C)  TempSrc: Oral Oral Oral Oral  Resp: 16 14  17   Height:      Weight:      SpO2: 97% 96%  100%    Weight change:  Filed Weights   04/07/13 1005 04/07/13 2100  Weight: 58.968 kg (130 lb) 60.6 kg (133 lb 9.6 oz)   Body mass index is 26.97 kg/(m^2).   Gen Exam: Awake and alert with clear speech.   Neck: Supple, No JVD.   Chest: B/L Clear.   CVS: S1 S2 Regular, no murmurs.  Abdomen: soft, BS +,  tender in the epigastric area, non distended.  Extremities: no edema, lower extremities warm to touch. Neurologic: Non Focal.   Skin: No Rash.   Wounds: N/A.    Intake/Output from previous day:  Intake/Output Summary (Last 24 hours) at 04/09/13 1247 Last data filed at 04/09/13 0630  Gross per 24 hour  Intake   2595 ml  Output      2 ml  Net   2593 ml     LAB RESULTS: CBC  Recent Labs Lab 04/07/13 1045 04/08/13 0654 04/09/13 0536  WBC 5.4 3.8* 3.7*  HGB 13.4 11.1* 11.0*  HCT 38.1 32.4* 32.3*  PLT 80* 63* 58*  MCV 95.5 94.7 96.1  MCH 33.6 32.5 32.7  MCHC 35.2 34.3 34.1  RDW 11.6 12.2 12.3  LYMPHSABS 0.7  --   --   MONOABS 0.4  --   --   EOSABS 0.1  --   --   BASOSABS 0.0  --   --     Chemistries  Recent Labs Lab 04/07/13 1045 04/08/13 0654 04/09/13 0536  NA 139 138 140  K 3.8 3.6 3.6  CL 100 105 106  CO2 27 23 25   GLUCOSE 104* 75 90  BUN 12 8 6   CREATININE 0.80 0.74 0.73  CALCIUM 10.1 8.5 8.5    CBG: No results found for this basename: GLUCAP,  in the last 168 hours  GFR Estimated Creatinine Clearance: 78.5 ml/min (by C-G formula based on Cr of 0.73).  Coagulation profile No results found for this basename: INR, PROTIME,  in the last 168 hours  Cardiac Enzymes No results found for this basename: CK, CKMB, TROPONINI, MYOGLOBIN,  in the last 168 hours  No components found with this basename: POCBNP,  No results found for this  basename: DDIMER,  in the last 72 hours No results found for this basename: HGBA1C,  in the last 72 hours No results found for this basename: CHOL, HDL, LDLCALC, TRIG, CHOLHDL, LDLDIRECT,  in the last 72 hours No results found for this basename: TSH, T4TOTAL, FREET3, T3FREE, THYROIDAB,  in the last 72 hours No results found for this basename: VITAMINB12, FOLATE, FERRITIN, TIBC, IRON, RETICCTPCT,  in the last 72 hours  Recent Labs  04/08/13 0654 04/09/13 0536  LIPASE 36 31    Urine Studies No results found for this basename: UACOL, UAPR, USPG, UPH, UTP, UGL, UKET, UBIL, UHGB, UNIT, UROB, ULEU, UEPI, UWBC, URBC, UBAC, CAST, CRYS, UCOM, BILUA,  in the last 72 hours  MICROBIOLOGY: No results found for this or any previous visit (from the past 240 hour(s)).  RADIOLOGY STUDIES/RESULTS: Ct Abdomen Pelvis W Contrast  04/07/2013   *RADIOLOGY REPORT*  Clinical Data: Right upper quadrant pain with nausea.  CT ABDOMEN AND PELVIS WITH CONTRAST  Technique:  Multidetector CT imaging of the abdomen and pelvis was performed following the standard protocol during bolus administration of intravenous contrast.  Contrast: 50mL OMNIPAQUE IOHEXOL 300 MG/ML  SOLN, OMNIPAQUE IOHEXOL 300 MG/ML  SOLN  Comparison: None.  Findings: The patient has periportal edema in the liver.  Liver is otherwise normal.  Gallbladder has been removed.  No dilated bile ducts.  The pancreas appears normal.  Spleen, adrenal glands, kidneys, and bowel appear normal including the terminal ileum and appendix.  Uterus and ovaries are normal. No osseous abnormality.  No free air or free fluid or adenopathy.  IMPRESSION: Periportal edema, nonspecific.  Otherwise, normal exam.   Original Report Authenticated By: Francene Boyers, M.D.    Jeoffrey Massed, MD  [facility] Pager:[phone]  If 7PM-7AM, please contact night-coverage [email or web address] Password [email or web address] 04/09/2013, 12:47 PM   LOS: 2 days

## 2013-04-09 NOTE — Progress Notes (Signed)
     [facility] Daily Rounding Note 04/09/2013, 11:05 AM  SUBJECTIVE:       Pain in left flank, worse if she lays on that side.  Headache for several hours last night and overnight, Oxycodone did not help she thought it might be accelerating the headache and made her feel woozy.  Headache better after Motrin.  Says tylenol makes her get leg pain when she stands up so she never takes this  Some nausea this AM after eating pancake and yogurt parfait.  No emesis  OBJECTIVE:         Vital signs in last 24 hours:    Temp:  [98 F (36.7 C)-98.3 F (36.8 C)] 98.3 F (36.8 C) (07/03 1041) Pulse Rate:  [61-73] 73 (07/03 1041) Resp:  [14-20] 17 (07/03 1041) BP: (104-118)/(69-77) 104/69 mmHg (07/03 1041) SpO2:  [96 %-100 %] 100 % (07/03 1041) Last BM Date: 04/08/13 General: tired, not ill looking   Heart: RRR Chest: clear B Abdomen: soft, slightly tender in upper abdomen  .  No guard or rebound.  Hypoactive BS  Extremities: no CCE Neuro/Psych:  No confusion.   Intake/Output from previous day: 07/02 0701 - 07/03 0700 In: 2655 [P.O.:240; I.V.:2415] Out: 2 [Urine:2]  Intake/Output this shift:    Lab Results:  Recent Labs  04/07/13 1045 04/08/13 0654 04/09/13 0536  WBC 5.4 3.8* 3.7*  HGB 13.4 11.1* 11.0*  HCT 38.1 32.4* 32.3*  PLT 80* 63* 58*   BMET  Recent Labs  04/07/13 1045 04/08/13 0654 04/09/13 0536  NA 139 138 140  K 3.8 3.6 3.6  CL 100 105 106  CO2 27 23 25   GLUCOSE 104* 75 90  BUN 12 8 6   CREATININE 0.80 0.74 0.73  CALCIUM 10.1 8.5 8.5   LFT  Recent Labs  04/07/13 1045 04/09/13 0536  PROT 7.8 5.9*  ALBUMIN 4.7 3.6  AST 62* 190*  ALT 35 296*  ALKPHOS 56 62  BILITOT 0.5 0.7  Lipase                          31  ASSESMENT: * Acute pancreatitis.  Lipase rapidly normalized. AST and ALT rising.  t bili and ALKphos normal.  Periportal edema but normal biliary ducts on CT scan.  * S/p 2009 cholecystectomy for biliary dyskinesia, abdominal pain. In [place].   ?SOD dysfunction.  * Intermittent rectal bleeding. Int rrhoids and possible anal fissure on 10/2011 office anoscopy.  * Hx ITP. Note platelets dropping. Baseline is 80s to low 100s.    PLAN: *  MRCP today. *  LFTs, CBC in AM,   LOS: 2 days   Jennye Moccasin  04/09/2013, 11:05 AM Pager: [phone]

## 2013-04-10 LAB — CBC
HCT: 34.2 % — ABNORMAL LOW (ref 36.0–46.0)
Hemoglobin: 11.7 g/dL — ABNORMAL LOW (ref 12.0–15.0)
MCV: 95.3 fL (ref 78.0–100.0)
WBC: 3.5 10*3/uL — ABNORMAL LOW (ref 4.0–10.5)

## 2013-04-10 LAB — COMPREHENSIVE METABOLIC PANEL
ALT: 215 U/L — ABNORMAL HIGH (ref 0–35)
AST: 67 U/L — ABNORMAL HIGH (ref 0–37)
Albumin: 3.8 g/dL (ref 3.5–5.2)
CO2: 27 mEq/L (ref 19–32)
Calcium: 9.1 mg/dL (ref 8.4–10.5)
Creatinine, Ser: 0.74 mg/dL (ref 0.50–1.10)
GFR calc non Af Amer: >90 mL/min (ref >90)
Sodium: 142 mEq/L (ref 135–145)

## 2013-04-10 LAB — PROTIME-INR: INR: 1.02 (ref 0.00–1.49)

## 2013-04-10 LAB — IGG: IgG (Immunoglobin G), Serum: 789 mg/dL (ref 690–1700)

## 2013-04-10 NOTE — Progress Notes (Signed)
PATIENT DETAILS Name: Brittany Austin Age: 37 y.o. Sex: female Date of Birth: [birth year] Admit Date: 04/07/2013 Admitting Physician Kela Millin, MD WUJ:[id number],FAOZHY, MD  Subjective: No further headaches, only very minimal abdominal pain. No nausea .Looks a whole lot better than yesterday.  Assessment/Plan: Abdominal pain - Not sure exactly what the etiology is- GI suspecting  sphincter of Oddi dysfunction, lipase elevated on admission-pancreatitis definite other differential-however no changes of pancreatitis seen on recent CT scan of the abdomen. MRCP of the abdomen does not show any biliary obstruction. - She was admitted and given IV fluids and other supportive care. She is now tolerating a regular diet. 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 Await CBC 7/4 - May need hematology inpatient evaluation if it continues to go down.  Disposition: Remain inpatient- potential discharge later today  DVT Prophylaxis: Prophylactic SCD's  Code Status: Full code   Family Communication Partner at bedside  Procedures:  None  CONSULTS:  GI  MEDICATIONS: Scheduled Meds: . pantoprazole  40 mg Oral Q0600   Continuous Infusions: . sodium chloride 100 mL/hr at 04/10/13 0408   PRN Meds:.acetaminophen, acetaminophen, alum & mag hydroxide-simeth, HYDROmorphone (DILAUDID) injection, ondansetron (ZOFRAN) IV, ondansetron, oxyCODONE,  zolpidem  Antibiotics: Anti-infectives   None       PHYSICAL EXAM: Vital signs in last 24 hours: Filed Vitals:   04/10/13 0216 04/10/13 0500 04/10/13 0520 04/10/13 1000  BP: 124/80  126/85 127/77  Pulse: 64  67 63  Temp: 97.8 F (36.6 C)  98.2 F (36.8 C) 98.6 F (37 C)  TempSrc: Oral  Oral Oral  Resp: 18  18 16   Height:      Weight:      SpO2: 100% 98%  100%    Weight change:  Filed Weights   04/07/13 1005 04/07/13 2100  Weight: 58.968 kg (130 lb) 60.6 kg (133 lb 9.6 oz)   Body mass index is 26.97 kg/(m^2).   Gen Exam: Awake and alert with clear speech.   Neck: Supple, No JVD.   Chest: B/L Clear.   CVS: S1 S2 Regular, no murmurs.  Abdomen: soft, BS +, minimal tenderness in the epigastric area, non distended.  Extremities: no edema, lower extremities warm to touch. Neurologic: Non Focal.   Skin: No Rash.   Wounds: N/A.    Intake/Output from previous day:  Intake/Output Summary (Last 24 hours) at 04/10/13 1036 Last data filed at 04/09/13 1836  Gross per 24 hour  Intake   1420 ml  Output      0 ml  Net   1420 ml     LAB RESULTS: CBC  Recent Labs Lab 04/07/13 1045 04/08/13 0654 04/09/13 0536  WBC 5.4 3.8* 3.7*  HGB 13.4 11.1* 11.0*  HCT 38.1 32.4* 32.3*  PLT 80* 63* 58*  MCV 95.5 94.7 96.1  MCH 33.6 32.5 32.7  MCHC 35.2 34.3 34.1  RDW 11.6 12.2 12.3  LYMPHSABS 0.7  --   --   MONOABS 0.4  --   --  EOSABS 0.1  --   --   BASOSABS 0.0  --   --     Chemistries   Recent Labs Lab 04/07/13 1045 04/08/13 0654 04/09/13 0536 04/10/13 0750  NA 139 138 140 142  K 3.8 3.6 3.6 4.1  CL 100 105 106 107  CO2 27 23 25 27   GLUCOSE 104* 75 90 89  BUN 12 8 6 6   CREATININE 0.80 0.74 0.73 0.74  CALCIUM 10.1 8.5 8.5 9.1    CBG: No results found for this basename: GLUCAP,  in the last 168 hours  GFR Estimated Creatinine Clearance: 78.5 ml/min (by C-G formula based on Cr of 0.74).  Coagulation profile  Recent Labs Lab 04/10/13 0750  INR 1.02     Cardiac Enzymes No results found for this basename: CK, CKMB, TROPONINI, MYOGLOBIN,  in the last 168 hours  No components found with this basename: POCBNP,  No results found for this basename: DDIMER,  in the last 72 hours No results found for this basename: HGBA1C,  in the last 72 hours No results found for this basename: CHOL, HDL, LDLCALC, TRIG, CHOLHDL, LDLDIRECT,  in the last 72 hours No results found for this basename: TSH, T4TOTAL, FREET3, T3FREE, THYROIDAB,  in the last 72 hours No results found for this basename: VITAMINB12, FOLATE, FERRITIN, TIBC, IRON, RETICCTPCT,  in the last 72 hours  Recent Labs  04/08/13 0654 04/09/13 0536  LIPASE 36 31    Urine Studies No results found for this basename: UACOL, UAPR, USPG, UPH, UTP, UGL, UKET, UBIL, UHGB, UNIT, UROB, ULEU, UEPI, UWBC, URBC, UBAC, CAST, CRYS, UCOM, BILUA,  in the last 72 hours  MICROBIOLOGY: No results found for this or any previous visit (from the past 240 hour(s)).  RADIOLOGY STUDIES/RESULTS: Ct Abdomen Pelvis W Contrast  04/07/2013   *RADIOLOGY REPORT*  Clinical Data: Right upper quadrant pain with nausea.  CT ABDOMEN AND PELVIS WITH CONTRAST  Technique:  Multidetector CT imaging of the abdomen and pelvis was performed following the standard protocol during bolus administration of intravenous contrast.  Contrast: 50mL OMNIPAQUE IOHEXOL 300 MG/ML  SOLN, OMNIPAQUE IOHEXOL 300 MG/ML  SOLN  Comparison: None.  Findings: The patient has periportal edema in the liver.  Liver is otherwise normal.  Gallbladder has been removed.  No dilated bile ducts.  The pancreas appears normal.  Spleen, adrenal glands, kidneys, and bowel appear normal including the terminal ileum and appendix.  Uterus and ovaries are normal. No osseous abnormality.  No free air or free fluid or adenopathy.  IMPRESSION: Periportal edema, nonspecific.  Otherwise, normal exam.   Original Report Authenticated By: Francene Boyers, M.D.    Jeoffrey Massed,  MD  [facility] Pager:[phone]  If 7PM-7AM, please contact night-coverage [email or web address] Password [email or web address] 04/10/2013, 10:36 AM   LOS: 3 days

## 2013-04-10 NOTE — Progress Notes (Signed)
[facility] Progress Note   Subjective  Feels okay today, no new complaints.  Very min abd pain, no n/v.  Tolerated breakfast.   Objective  Vital signs in last 24 hours: Temp:  [97.8 F (36.6 C)-98.8 F (37.1 C)] 98.6 F (37 C) (07/04 1000) Pulse Rate:  [63-68] 63 (07/04 1000) Resp:  [16-18] 16 (07/04 1000) BP: (100-127)/(64-85) 127/77 mmHg (07/04 1000) SpO2:  [97 %-100 %] 100 % (07/04 1000) Last BM Date: 04/08/13 Gen: awake, alert, NAD HEENT: anicteric, op clear CV: RRR, no mrg Pulm: CTA b/l Abd: soft, epigastric tenderness without rebound or guarind, ND, +BS throughout Ext: no c/c/e Neuro: nonfocal   Intake/Output from previous day: 07/03 0701 - 07/04 0700 In: 1420 [P.O.:210; I.V.:1210] Out: -  Intake/Output this shift:    Lab Results:  Recent Labs  04/08/13 0654 04/09/13 0536 04/10/13 0750  WBC 3.8* 3.7* 3.5*  HGB 11.1* 11.0* 11.7*  HCT 32.4* 32.3* 34.2*  PLT 63* 58* 74*   BMET  Recent Labs  04/08/13 0654 04/09/13 0536 04/10/13 0750  NA 138 140 142  K 3.6 3.6 4.1  CL 105 106 107  CO2 23 25 27   GLUCOSE 75 90 89  BUN 8 6 6   CREATININE 0.74 0.73 0.74  CALCIUM 8.5 8.5 9.1   LFT  Recent Labs  04/10/13 0750  PROT 6.6  ALBUMIN 3.8  AST 67*  ALT 215*  ALKPHOS 62  BILITOT 0.4   PT/INR  Recent Labs  04/10/13 0750  LABPROT 13.2  INR 1.02   Hepatitis Panel No results found for this basename: HEPBSAG, HCVAB, HEPAIGM, HEPBIGM,  in the last 72 hours  Studies/Results: Mr 3d Recon At Scanner  04/09/2013   *RADIOLOGY REPORT*  Clinical Data:  Abdominal pain.  Elevated liver function tests. Rule out common bile duct stone.  MRI ABDOMEN WITHOUT AND WITH CONTRAST (INCLUDING MRCP)  Technique:  Multiplanar multisequence MR imaging of the abdomen was performed both before and after the administration of intravenous contrast. Heavily T2-weighted images of the biliary and pancreatic ducts were obtained, and three-dimensional MRCP images were  rendered by post processing.  Contrast: 15mL MULTIHANCE GADOBENATE DIMEGLUMINE 529 MG/ML IV SOLN,  Comparison:  CT of 04/07/2013  Findings:  Normal heart size without pericardial or pleural effusion.  No focal liver lesion.  No intrahepatic biliary ductal dilatation.  Normal spleen, stomach, pancreas.  No pancreatic ductal dilatation. Status post cholecystectomy.  Common duct is normal in caliber, measuring maximally 7 mm on image 62/series 7.  Tapers to 6 mm in the region of the pancreatic head on image 57/series 7.  Incidental note is made of a low insertion of the cystic duct remnant on image 54/series 7. No evidence of choledocholithiasis. Mild periportal edema is again identified.  Normal adrenal glands and right kidney.  A lower pole tiny left renal cyst.  Mildly prominent periportal nodes which are likely reactive, including on image 17/series 6.  There is narrowing of the celiac origin with poststenotic dilatation on image 53/series 1601.  Likely secondary to median arcuate ligament compression.  IMPRESSION:  1.  No evidence of biliary ductal dilatation or common duct stone. 2. No other explanation for elevated liver function tests. 3.  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   Original Report Authenticated By: Jeronimo Greaves, M.D.   Mr Jorja Loa Cm/mrcp  04/09/2013   *RADIOLOGY REPORT*  Clinical Data:  Abdominal pain.  Elevated liver function tests. Rule out common bile  duct stone.  MRI ABDOMEN WITHOUT AND WITH CONTRAST (INCLUDING MRCP)  Technique:  Multiplanar multisequence MR imaging of the abdomen was performed both before and after the administration of intravenous contrast. Heavily T2-weighted images of the biliary and pancreatic ducts were obtained, and three-dimensional MRCP images were rendered by post processing.  Contrast: 15mL MULTIHANCE GADOBENATE DIMEGLUMINE 529 MG/ML IV SOLN,  Comparison:  CT of 04/07/2013  Findings:  Normal heart size without  pericardial or pleural effusion.  No focal liver lesion.  No intrahepatic biliary ductal dilatation.  Normal spleen, stomach, pancreas.  No pancreatic ductal dilatation. Status post cholecystectomy.  Common duct is normal in caliber, measuring maximally 7 mm on image 62/series 7.  Tapers to 6 mm in the region of the pancreatic head on image 57/series 7.  Incidental note is made of a low insertion of the cystic duct remnant on image 54/series 7. No evidence of choledocholithiasis. Mild periportal edema is again identified.  Normal adrenal glands and right kidney.  A lower pole tiny left renal cyst.  Mildly prominent periportal nodes which are likely reactive, including on image 17/series 6.  There is narrowing of the celiac origin with poststenotic dilatation on image 53/series 1601.  Likely secondary to median arcuate ligament compression.  IMPRESSION:  1.  No evidence of biliary ductal dilatation or common duct stone. 2. No other explanation for elevated liver function tests. 3.  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   Original Report Authenticated By: Jeronimo Greaves, M.D.      Assessment & Plan  37 yo with elevated LFTs (AST/ALT), epigastric pain in episodic fashion, elevated lipase on presentation without radiographic evidence for pancreatitis.  Also with thrombocytopenia with hx of ITP.  1.  Epigastric pain/elevated LFTs -- MRCP does not show ductal dilation or evidence of stone/obstruction. LFTs are down-trending.  Acute hep panel and ANA are pending, but my suspicion for either viral hep and AIH are low.  IgG is normal.  Still SOD is possible --Plan EGD tomorrow (ate this am) --Trend LFTs, await pending studies --outpt followup with Leone Payor to monitor liver enzymes and further discuss SOD  2.  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  I have asked vascular surgery team (attg is [facility]) for  opinion.  Does this need  to be further evaluated? Could this explain abd pain?  She has lost weight, 40 lbs, which she says is intention.  Her abd pain is not always post-prandial and she does not fear eating.   3.  Low Plts -- primary team following, a bit higher today at 74K, which is reassuring  Principal Problem:   Pancreatitis Active Problems:   Abdominal pain, epigastric     LOS: 3 days   Standley Bargo M  04/10/2013, 12:19 PM

## 2013-04-11 ENCOUNTER — Encounter (HOSPITAL_COMMUNITY)
Admission: EM | Disposition: A | Discharge status: Home / Self Care | Payer: Self-pay | Source: Home / Self Care | Attending: Internal Medicine

## 2013-04-11 ENCOUNTER — Encounter (HOSPITAL_COMMUNITY): Payer: Self-pay

## 2013-04-11 HISTORY — PX: ESOPHAGOGASTRODUODENOSCOPY: SHX5428

## 2013-04-11 LAB — COMPREHENSIVE METABOLIC PANEL
ALT: 139 U/L — ABNORMAL HIGH (ref 0–35)
AST: 35 U/L (ref 0–37)
Alkaline Phosphatase: 50 U/L (ref 39–117)
CO2: 26 mEq/L (ref 19–32)
Chloride: 107 mEq/L (ref 96–112)
GFR calc Af Amer: >90 mL/min (ref >90)
GFR calc non Af Amer: >90 mL/min (ref >90)
Glucose, Bld: 98 mg/dL (ref 70–99)
Sodium: 140 mEq/L (ref 135–145)
Total Bilirubin: 0.2 mg/dL — ABNORMAL LOW (ref 0.3–1.2)

## 2013-04-11 LAB — CBC
Hemoglobin: 10.7 g/dL — ABNORMAL LOW (ref 12.0–15.0)
MCH: 32.5 pg (ref 26.0–34.0)
MCHC: 34.5 g/dL (ref 30.0–36.0)
Platelets: 62 10*3/uL — ABNORMAL LOW (ref 150–400)
RDW: 12.2 % (ref 11.5–15.5)

## 2013-04-11 LAB — LIPASE, BLOOD: Lipase: 64 U/L — ABNORMAL HIGH (ref 11–59)

## 2013-04-11 SURGERY — EGD (ESOPHAGOGASTRODUODENOSCOPY)
Anesthesia: Moderate Sedation

## 2013-04-11 MED ORDER — ONDANSETRON HCL 4 MG/2ML IJ SOLN
INTRAMUSCULAR | Status: DC | PRN
  Administered 2013-04-11: 4 mg via INTRAVENOUS

## 2013-04-11 MED ORDER — MIDAZOLAM HCL 10 MG/2ML IJ SOLN
INTRAMUSCULAR | Status: DC | PRN
  Administered 2013-04-11: 1 mg via INTRAVENOUS
  Administered 2013-04-11 (×2): 2 mg via INTRAVENOUS
  Administered 2013-04-11: 1 mg via INTRAVENOUS

## 2013-04-11 MED ORDER — ONDANSETRON HCL 4 MG/2ML IJ SOLN
INTRAMUSCULAR | Status: AC
  Filled 2013-04-11: qty 2

## 2013-04-11 MED ORDER — BUTAMBEN-TETRACAINE-BENZOCAINE 2-2-14 % EX AERO
INHALATION_SPRAY | CUTANEOUS | Status: DC | PRN
  Administered 2013-04-11: 1 via TOPICAL

## 2013-04-11 MED ORDER — FENTANYL CITRATE 0.05 MG/ML IJ SOLN
INTRAMUSCULAR | Status: DC | PRN
  Administered 2013-04-11 (×2): 25 ug via INTRAVENOUS

## 2013-04-11 MED ORDER — SODIUM CHLORIDE 0.9 % IV SOLN: INTRAVENOUS | Status: DC

## 2013-04-11 NOTE — Progress Notes (Signed)
PATIENT DETAILS Name: Brittany Austin Age: 37 y.o. Sex: female Date of Birth: July 02, 1976 Admit Date: 04/07/2013 Admitting Physician Adeline Joselyn Glassman, MD AVW:[id number], MD  Subjective: Only minimal abdominal "discomfort"  Assessment/Plan: Abdominal pain - Not sure exactly what the etiology is- GI suspecting  sphincter of Oddi dysfunction, lipase elevated on admission-pancreatitis definite other differential-however no changes of pancreatitis seen on recent CT scan of the abdomen. MRCP of the abdomen does not show any biliary obstruction. - She was admitted and given IV fluids and other supportive care. She is now tolerating a regular diet. 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 -Platelets stable at 62 today-suspect as her acute illness subsides-this will slowly rebound back to her usual baseline - May need hematology inpatient evaluation if it continues to go down.  Disposition: Remain inpatient- potential discharge later today  DVT Prophylaxis: Prophylactic SCD's  Code Status: Full code   Family Communication Partner at bedside  Procedures:  None  CONSULTS:  GI  MEDICATIONS: Scheduled Meds: . [facility] HOLD] pantoprazole  40 mg Oral Q0600   Continuous Infusions: . sodium chloride 50 mL/hr at 04/11/13 0506  . sodium chloride     PRN Meds:.[[facility] HOLD] acetaminophen, [MAR HOLD] acetaminophen, [MAR HOLD] alum & mag  hydroxide-simeth, [MAR HOLD]  HYDROmorphone (DILAUDID) injection, [MAR HOLD] ondansetron (ZOFRAN) IV, [MAR HOLD] ondansetron, [MAR HOLD] oxyCODONE, [MAR HOLD] zolpidem  Antibiotics: Anti-infectives   None       PHYSICAL EXAM: Vital signs in last 24 hours: Filed Vitals:   04/11/13 0905 04/11/13 0910 04/11/13 0915 04/11/13 0920  BP: 119/83 121/78 125/79 130/91  Pulse:      Temp:      TempSrc:      Resp: 16 15 15 18   Height:      Weight:      SpO2: 96% 94% 94% 96%    Weight change:  Filed Weights   04/07/13 1005 04/07/13 2100  Weight: 58.968 kg (130 lb) 60.6 kg (133 lb 9.6 oz)   Body mass index is 26.97 kg/(m^2).   Gen Exam: Awake and alert with clear speech.   Neck: Supple, No JVD.   Chest: B/L Clear.   CVS: S1 S2 Regular, no murmurs.  Abdomen: soft, BS +, minimal tenderness in the epigastric area, non distended.  Extremities: no edema, lower extremities warm to touch. Neurologic: Non Focal.   Skin: No Rash.   Wounds: N/A.    Intake/Output from previous day:  Intake/Output Summary (Last 24 hours) at 04/11/13 0931 Last data filed at 04/11/13 0603  Gross per 24 hour  Intake 3041.67 ml  Output      0 ml  Net 3041.67 ml     LAB RESULTS: CBC  Recent Labs Lab 04/07/13 1045 04/08/13 0654 04/09/13 0536 04/10/13 0750 04/11/13 0403  WBC 5.4 3.8* 3.7* 3.5* 3.7*  HGB 13.4 11.1* 11.0* 11.7* 10.7*  HCT 38.1 32.4* 32.3* 34.2* 31.0*  PLT 80* 63* 58* 74* 62*  MCV 95.5  94.7 96.1 95.3 94.2  MCH 33.6 32.5 32.7 32.6 32.5  MCHC 35.2 34.3 34.1 34.2 34.5  RDW 11.6 12.2 12.3 12.2 12.2  LYMPHSABS 0.7  --   --   --   --   MONOABS 0.4  --   --   --   --   EOSABS 0.1  --   --   --   --   BASOSABS 0.0  --   --   --   --     Chemistries   Recent Labs Lab 04/07/13 1045 04/08/13 0654 04/09/13 0536 04/10/13 0750 04/11/13 0403  NA 139 138 140 142 140  K 3.8 3.6 3.6 4.1 3.5  CL 100 105 106 107 107  CO2 27 23 25 27 26   GLUCOSE 104* 75 90 89 98  BUN 12 8 6 6 9    CREATININE 0.80 0.74 0.73 0.74 0.67  CALCIUM 10.1 8.5 8.5 9.1 8.6    CBG: No results found for this basename: GLUCAP,  in the last 168 hours  GFR Estimated Creatinine Clearance: 78.5 ml/min (by C-G formula based on Cr of 0.67).  Coagulation profile  Recent Labs Lab 04/10/13 0750  INR 1.02    Cardiac Enzymes No results found for this basename: CK, CKMB, TROPONINI, MYOGLOBIN,  in the last 168 hours  No components found with this basename: POCBNP,  No results found for this basename: DDIMER,  in the last 72 hours No results found for this basename: HGBA1C,  in the last 72 hours No results found for this basename: CHOL, HDL, LDLCALC, TRIG, CHOLHDL, LDLDIRECT,  in the last 72 hours No results found for this basename: TSH, T4TOTAL, FREET3, T3FREE, THYROIDAB,  in the last 72 hours No results found for this basename: VITAMINB12, FOLATE, FERRITIN, TIBC, IRON, RETICCTPCT,  in the last 72 hours  Recent Labs  04/09/13 0536 04/11/13 0403  LIPASE 31 64*    Urine Studies No results found for this basename: UACOL, UAPR, USPG, UPH, UTP, UGL, UKET, UBIL, UHGB, UNIT, UROB, ULEU, UEPI, UWBC, URBC, UBAC, CAST, CRYS, UCOM, BILUA,  in the last 72 hours  MICROBIOLOGY: No results found for this or any previous visit (from the past 240 hour(s)).  RADIOLOGY STUDIES/RESULTS: Ct Abdomen Pelvis W Contrast  04/07/2013   *RADIOLOGY REPORT*  Clinical Data: Right upper quadrant pain with nausea.  CT ABDOMEN AND PELVIS WITH CONTRAST  Technique:  Multidetector CT imaging of the abdomen and pelvis was performed following the standard protocol during bolus administration of intravenous contrast.  Contrast: 50mL OMNIPAQUE IOHEXOL 300 MG/ML  SOLN, OMNIPAQUE IOHEXOL 300 MG/ML  SOLN  Comparison: None.  Findings: The patient has periportal edema in the liver.  Liver is otherwise normal.  Gallbladder has been removed.  No dilated bile ducts.  The pancreas appears normal.  Spleen, adrenal glands, kidneys, and  bowel appear normal including the terminal ileum and appendix.  Uterus and ovaries are normal. No osseous abnormality.  No free air or free fluid or adenopathy.  IMPRESSION: Periportal edema, nonspecific.  Otherwise, normal exam.   Original Report Authenticated By: Francene Boyers, M.D.    Jeoffrey Massed, MD  [facility] Pager:[phone]  If 7PM-7AM, please contact night-coverage [email or web address] Password [email or web address] 04/11/2013, 9:31 AM   LOS: 4 days

## 2013-04-11 NOTE — Discharge Summary (Addendum)
PATIENT DETAILS Name: Brittany Austin Age: 37 y.o. Sex: female Date of Birth: 01-26-1976 MRN: [id number]. Admit Date: 04/07/2013 Admitting Physician: Kela Millin, MD GEX:[id number],NUUVOZ, MD  Recommendations for Outpatient Follow-up: Await EGD biopsy results Follow-up of helicobacter pylori status, treat if indicated Please repeat LFTs during next visit Please repeat CBC during next visit- to check platelets Please follow hepatitis serology and ANA data pending at the time of discharge.  PRIMARY DISCHARGE DIAGNOSIS:  Abdominal pain-? Pancreatitis vs Hepatitis  Transaminitis- ? Etiology unknown- hepatitis serology pending at the time of discharge   Immune thrombocytopenia  PAST MEDICAL HISTORY: Past Medical History  Diagnosis Date  . ITP (idiopathic thrombocytopenic purpura)   . Hearing loss of both ears   . Kidney stones   . Blood transfusion   . Cysts     scalp and left upper arm  . Rectal bleeding   . Rectal pain   . Bleeding internal hemorrhoids 10/2011    possible anal fissure also seen on office anoscopy    DISCHARGE MEDICATIONS:   Medication List    STOP taking these medications       fluconazole 150 MG tablet  Commonly known as:  DIFLUCAN     nitrofurantoin (macrocrystal-monohydrate) 100 MG capsule  Commonly known as:  MACROBID      TAKE these medications       AMBULATORY NON FORMULARY MEDICATION  Apply 1 application topically 2 (two) times daily. Medication Name: Diltiazem 2% gel apply small amount (pea-sized) to anal area     multivitamin tablet  Take 1 tablet by mouth daily.     NON FORMULARY  Pine Nut Oil  1 teaspoon 1-2 times qd     phenazopyridine 100 MG tablet  Commonly known as:  PYRIDIUM  Take 100 mg by mouth 3 (three) times daily as needed for pain.     polyethylene glycol packet  Commonly known as:  MIRALAX / GLYCOLAX  Take 17 g by mouth daily as needed.     VITAMIN B-12 CR PO  Take 1 tablet by mouth daily.         ALLERGIES:  No Known Allergies  BRIEF HPI:  See H&P, Labs, Consult and Test reports for all details in brief, patient is a 37 year old female with a history of ITP who presented to [facility] emergency room with sudden onset of severe epigastric pain associated nausea and vomiting. He was then admitted for further evaluation and treatment.  CONSULTATIONS:   GI  PERTINENT RADIOLOGIC STUDIES: Ct Abdomen Pelvis W Contrast  04/07/2013   *RADIOLOGY REPORT*  Clinical Data: Right upper quadrant pain with nausea.  CT ABDOMEN AND PELVIS WITH CONTRAST  Technique:  Multidetector CT imaging of the abdomen and pelvis was performed following the standard protocol during bolus administration of intravenous contrast.  Contrast: 50mL OMNIPAQUE IOHEXOL 300 MG/ML  SOLN, OMNIPAQUE IOHEXOL 300 MG/ML  SOLN  Comparison: None.  Findings: The patient has periportal edema in the liver.  Liver is otherwise normal.  Gallbladder has been removed.  No dilated bile ducts.  The pancreas appears normal.  Spleen, adrenal glands, kidneys, and bowel appear normal including the terminal ileum and appendix.  Uterus and ovaries are normal. No osseous abnormality.  No free air or free fluid or adenopathy.  IMPRESSION: Periportal edema, nonspecific.  Otherwise, normal exam.   Original Report Authenticated By: Francene Boyers, M.D.   Mr 3d Recon At Scanner  04/09/2013   *RADIOLOGY REPORT*  Clinical Data:  Abdominal  pain.  Elevated liver function tests. Rule out common bile duct stone.  MRI ABDOMEN WITHOUT AND WITH CONTRAST (INCLUDING MRCP)  Technique:  Multiplanar multisequence MR imaging of the abdomen was performed both before and after the administration of intravenous contrast. Heavily T2-weighted images of the biliary and pancreatic ducts were obtained, and three-dimensional MRCP images were rendered by post processing.  Contrast: 15mL MULTIHANCE GADOBENATE DIMEGLUMINE 529 MG/ML IV SOLN,  Comparison:  CT of 04/07/2013   Findings:  Normal heart size without pericardial or pleural effusion.  No focal liver lesion.  No intrahepatic biliary ductal dilatation.  Normal spleen, stomach, pancreas.  No pancreatic ductal dilatation. Status post cholecystectomy.  Common duct is normal in caliber, measuring maximally 7 mm on image 62/series 7.  Tapers to 6 mm in the region of the pancreatic head on image 57/series 7.  Incidental note is made of a low insertion of the cystic duct remnant on image 54/series 7. No evidence of choledocholithiasis. Mild periportal edema is again identified.  Normal adrenal glands and right kidney.  A lower pole tiny left renal cyst.  Mildly prominent periportal nodes which are likely reactive, including on image 17/series 6.  There is narrowing of the celiac origin with poststenotic dilatation on image 53/series 1601.  Likely secondary to median arcuate ligament compression.  IMPRESSION:  1.  No evidence of biliary ductal dilatation or common duct stone. 2. No other explanation for elevated liver function tests. 3.  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   Original Report Authenticated By: Jeronimo Greaves, M.D.   Mr Jorja Loa Cm/mrcp  04/09/2013   *RADIOLOGY REPORT*  Clinical Data:  Abdominal pain.  Elevated liver function tests. Rule out common bile duct stone.  MRI ABDOMEN WITHOUT AND WITH CONTRAST (INCLUDING MRCP)  Technique:  Multiplanar multisequence MR imaging of the abdomen was performed both before and after the administration of intravenous contrast. Heavily T2-weighted images of the biliary and pancreatic ducts were obtained, and three-dimensional MRCP images were rendered by post processing.  Contrast: 15mL MULTIHANCE GADOBENATE DIMEGLUMINE 529 MG/ML IV SOLN,  Comparison:  CT of 04/07/2013  Findings:  Normal heart size without pericardial or pleural effusion.  No focal liver lesion.  No intrahepatic biliary ductal dilatation.  Normal spleen, stomach, pancreas.   No pancreatic ductal dilatation. Status post cholecystectomy.  Common duct is normal in caliber, measuring maximally 7 mm on image 62/series 7.  Tapers to 6 mm in the region of the pancreatic head on image 57/series 7.  Incidental note is made of a low insertion of the cystic duct remnant on image 54/series 7. No evidence of choledocholithiasis. Mild periportal edema is again identified.  Normal adrenal glands and right kidney.  A lower pole tiny left renal cyst.  Mildly prominent periportal nodes which are likely reactive, including on image 17/series 6.  There is narrowing of the celiac origin with poststenotic dilatation on image 53/series 1601.  Likely secondary to median arcuate ligament compression.  IMPRESSION:  1.  No evidence of biliary ductal dilatation or common duct stone. 2. No other explanation for elevated liver function tests. 3.  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   Original Report Authenticated By: Jeronimo Greaves, M.D.     PERTINENT LAB RESULTS: CBC:  Recent Labs  04/10/13 0750 04/11/13 0403  WBC 3.5* 3.7*  HGB 11.7* 10.7*  HCT 34.2* 31.0*  PLT 74* 62*   CMET CMP  Component Value Date/Time   NA 140 04/11/2013 0403   K 3.5 04/11/2013 0403   CL 107 04/11/2013 0403   CO2 26 04/11/2013 0403   GLUCOSE 98 04/11/2013 0403   BUN 9 04/11/2013 0403   CREATININE 0.67 04/11/2013 0403   CREATININE 0.75 01/29/2013 1530   CALCIUM 8.6 04/11/2013 0403   PROT 5.7* 04/11/2013 0403   ALBUMIN 3.2* 04/11/2013 0403   AST 35 04/11/2013 0403   ALT 139* 04/11/2013 0403   ALKPHOS 50 04/11/2013 0403   BILITOT 0.2* 04/11/2013 0403   GFRNONAA >90 04/11/2013 0403   GFRAA >90 04/11/2013 0403    GFR Estimated Creatinine Clearance: 78.5 ml/min (by C-G formula based on Cr of 0.67).  Recent Labs  04/09/13 0536 04/11/13 0403  LIPASE 31 64*   No results found for this basename: CKTOTAL, CKMB, CKMBINDEX, TROPONINI,  in the last 72 hours No components found with this  basename: POCBNP,  No results found for this basename: DDIMER,  in the last 72 hours No results found for this basename: HGBA1C,  in the last 72 hours No results found for this basename: CHOL, HDL, LDLCALC, TRIG, CHOLHDL, LDLDIRECT,  in the last 72 hours No results found for this basename: TSH, T4TOTAL, FREET3, T3FREE, THYROIDAB,  in the last 72 hours No results found for this basename: VITAMINB12, FOLATE, FERRITIN, TIBC, IRON, RETICCTPCT,  in the last 72 hours Coags:  Recent Labs  04/10/13 0750  INR 1.02   Microbiology: No results found for this or any previous visit (from the past 240 hour(s)).   BRIEF HOSPITAL COURSE:   Principal Problem: Abdominal pain Not sure exactly what the etiology is- GI suspecting sphincter of Oddi dysfunction, lipase elevated on admission-pancreatitis definitely on the differential-however no changes of pancreatitis seen on recent CT scan of the abdomen. MRCP of the abdomen does not show any biliary obstruction.  - She was admitted and given IV fluids and other supportive care. Her diet was slowly advanced and she is now tolerating a regular diet. She now only complains of vague epigastric discomfort, abdominal pain has resolved. She underwent a EGD on 7/5 which did not show any significant abnormalities, biopsies were taken and these are pending at the time of discharge. This will need to be followed by her primary gastroenterologist and primary care practitioner. - Her abdomen is soft, with very minimal tenderness in the epigastric region without any rebound or rigidity  - Is status post cholecystectomy in 2009 - Please also note, the MRCP, there was a question of celiac artery compression, gastroenterology discussed this with Dr. Hart Rochester of vascular surgery, it was felt that her clinical symptomatology was unlikely coming from this pathology. - I have spoken with Dr. Rhea Belton, who recommends no further workup since the patient is clinically better and is  recommending discharge as well.  Transaminitis Her AST and ALT on 7/3 had risen significantly since 7/1, today it is down trending-AST normalized, ALT down to 139  - ANA and acute hepatitis serology has been ordered- and is still pending, will need to be followed by her primary care practitioner and primary gastroenterologist - I have spoken with Dr. Rhea Belton, who recommends no further workup since the patient is clinically better and is recommending discharge as well.  Immune thrombocytopenia  - Platelets are slowly creeping down, we'll continue to monitor for now- avoid aspirin or any other nonsteroidal anti-inflammatory medications.  -Platelets stable at 62 today-suspect as her acute illness subsides-this will slowly rebound back to her  usual baseline -she has been asked to keep her appointment with Dr. Myna Hidalgo   TODAY-DAY OF DISCHARGE:  Subjective:   Paulla Fore today has no headache,no chest abdominal pain,no new weakness tingling or numbness. Her belly is soft, she is tolerating a regular diet and is deemed stable to be discharged home with close outpatient monitoring.  Objective:   Blood pressure 131/88, pulse 51, temperature 99 F (37.2 C), temperature source Oral, resp. rate 18, height 4\' 11"  (1.499 m), weight 60.6 kg (133 lb 9.6 oz), last menstrual period 04/07/2013, SpO2 99.00%.  Intake/Output Summary (Last 24 hours) at 04/11/13 1209 Last data filed at 04/11/13 0603  Gross per 24 hour  Intake 3041.67 ml  Output      0 ml  Net 3041.67 ml   Filed Weights   04/07/13 1005 04/07/13 2100  Weight: 58.968 kg (130 lb) 60.6 kg (133 lb 9.6 oz)    Exam Awake Alert, Oriented *3, No new F.N deficits, Normal affect [place].AT,PERRAL Supple Neck,No JVD, No cervical lymphadenopathy appriciated.  Symmetrical Chest wall movement, Good air movement bilaterally, CTAB RRR,No Gallops,Rubs or new Murmurs, No Parasternal Heave +ve B.Sounds, Abd Soft, Non tender, No organomegaly appriciated, No  rebound -guarding or rigidity. No Cyanosis, Clubbing or edema, No new Rash or bruise  DISCHARGE CONDITION: Stable  DISPOSITION: Home  DISCHARGE INSTRUCTIONS:    Activity:  As tolerated   Diet recommendation: Regular Diet   Discharge Orders   Future Orders Complete By Expires     Call MD for:  persistant nausea and vomiting  As directed     Call MD for:  severe uncontrolled pain  As directed     Call MD for:  temperature >100.4  As directed     Diet general  As directed     Increase activity slowly  As directed        Follow-up Information   Follow up with SCHOENHOFF,DEBBIE, MD. Schedule an appointment as soon as possible for a visit in 10 days.   Contact information:   [address] [place] [address] [phone]       Follow up with Stan Head, MD. Schedule an appointment as soon as possible for a visit in 2 weeks.   Contact information:   [address] [place] [address] [phone]      Total Time spent on discharge equals 45 minutes.  SignedJeoffrey Massed 04/11/2013 12:09 PM

## 2013-04-11 NOTE — Op Note (Signed)
[facility] [address] [place], [address]   ENDOSCOPY PROCEDURE REPORT  PATIENT: Brittany Austin, Brittany Austin  MR#: [id number] BIRTHDATE: 04/01/1976 , 36  yrs. old GENDER: Female ENDOSCOPIST: Beverley Fiedler, MD REFERRED BY:  [facility] PROCEDURE DATE:  04/11/2013 PROCEDURE:  EGD w/ biopsy for H.pylori ASA CLASS:     Class II INDICATIONS:  Epigastric pain. MEDICATIONS: These medications were titrated to patient response per physician's verbal order, Fentanyl 50 mcg IV, Versed 6 mg IV, and Zofran 4 mg IV TOPICAL ANESTHETIC: Cetacaine Spray  DESCRIPTION OF PROCEDURE: After the risks benefits and alternatives of the procedure were thoroughly explained, informed consent was obtained.  The Pentax Gastroscope [id number] endoscope was introduced through the mouth and advanced to the second portion of the duodenum. Without limitations.  The instrument was slowly withdrawn as the mucosa was fully examined.      The upper, middle and distal third of the esophagus were carefully inspected and no abnormalities were noted.  The z-line was well seen at the GEJ.  The endoscope was pushed into the fundus which was normal including a retroflexed view.  The antrum, gastric body, first and second part of the duodenum were unremarkable.  Biopsies were taken in the antrum and angularis.  Retroflexed views revealed no abnormalities.     The scope was then withdrawn from the patient and the procedure completed.  COMPLICATIONS: There were no complications.  ENDOSCOPIC IMPRESSION: Normal EGD; biopsies were taken in the antrum and angularis  RECOMMENDATIONS: 1.  Await biopsy results 2.  Follow-up of helicobacter pylori status, treat if indicated   eSigned:  Beverley Fiedler, MD 04/11/2013 9:03 AM   CC:The Patient and Stan Head, MD

## 2013-04-11 NOTE — Progress Notes (Signed)
I spoke to Dr. 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  He reviewed her chart and the imaging He felt that this was very unlikely to be clinically significant, and felt it very unlikely was contributing to abdominal symptomatology. He felt no further workup in this regard is necessary at this time I thank him for his input

## 2013-04-11 NOTE — Progress Notes (Signed)
Pt. discharged to floor,verbalized understanding of discharged instruction,medication,restriction,diet and follow up appointment.Baseline Vitals sign stable,Pt comfortable,no sign and symptom of distress. 

## 2013-04-13 ENCOUNTER — Encounter (HOSPITAL_COMMUNITY): Payer: Self-pay | Admitting: Internal Medicine

## 2013-04-13 ENCOUNTER — Telehealth: Payer: Self-pay

## 2013-04-13 DIAGNOSIS — R945 Abnormal results of liver function studies: Secondary | ICD-10-CM

## 2013-04-13 LAB — HEPATITIS PANEL, ACUTE
HCV Ab: NEGATIVE
Hep A IgM: NEGATIVE

## 2013-04-13 LAB — ANA: Anti Nuclear Antibody(ANA): POSITIVE — AB

## 2013-04-13 NOTE — Telephone Encounter (Signed)
I have left a detailed message for the patient about coming for labs this week and appt on 05/20/13 with Dr. Leone Payor.  She is asked to call for any questions or to reschedule the office visit if this is not convenient

## 2013-04-13 NOTE — Telephone Encounter (Signed)
Message copied by Annett Fabian on Mon Apr 13, 2013  4:05 PM ------      Message from: Beverley Fiedler      Created: Sat Apr 11, 2013  9:52 AM      Regarding: Rico Ala,       The above patient needs hepatic function panel and CBC mid to late week (July 7-11)      Was hospitalized with elevated LFTs.      She also needs hospital followup with Leone Payor in 2-4 weeks.      Thanks      JMP       ------

## 2013-04-14 ENCOUNTER — Encounter: Payer: Self-pay | Admitting: Internal Medicine

## 2013-04-14 ENCOUNTER — Ambulatory Visit (INDEPENDENT_AMBULATORY_CARE_PROVIDER_SITE_OTHER): Payer: BC Managed Care – PPO | Admitting: Internal Medicine

## 2013-04-14 ENCOUNTER — Telehealth: Payer: Self-pay | Admitting: *Deleted

## 2013-04-14 VITALS — BP 113/74 | HR 78 | Temp 97.8°F | Resp 18 | Ht 59.0 in | Wt 130.0 lb

## 2013-04-14 DIAGNOSIS — R519 Headache, unspecified: Secondary | ICD-10-CM | POA: Insufficient documentation

## 2013-04-14 DIAGNOSIS — D693 Immune thrombocytopenic purpura: Secondary | ICD-10-CM

## 2013-04-14 DIAGNOSIS — R51 Headache: Secondary | ICD-10-CM

## 2013-04-14 LAB — HEPATIC FUNCTION PANEL
Albumin: 5.1 g/dL (ref 3.5–5.2)
Alkaline Phosphatase: 61 U/L (ref 39–117)
Total Bilirubin: 0.5 mg/dL (ref 0.3–1.2)
Total Protein: 7.7 g/dL (ref 6.0–8.3)

## 2013-04-14 NOTE — Patient Instructions (Addendum)
Will schedule CT

## 2013-04-14 NOTE — Telephone Encounter (Signed)
Notified pt that Dr Margretta Sidle was on vacation and to please return Dr Bishop Dublin call tomorrow PM

## 2013-04-14 NOTE — Progress Notes (Signed)
  Subjective:    Patient ID: Brittany Austin, female    DOB: [birth year], 37 y.o.   MRN: [id number]  HPI [name] is here for hospital follow up.  She is here with her partner [name].   She presented to ER with severe epigastric/RUQ pain with N/V.  Lipase and transaminases elevated during hospital course.  CT/MRI showed normal pancreas and periportal edema of liver.  She is S/P cholecystectomy.  MRCP did not show evidence of retained stone or obstruction. Some question of celiac artery compression but not felt to be the cause of pts. Clinical presentation.  ?? Question of Sphincter of Oddi dysfunction but etiology not completely clear at this point.  She is eating adequately without problem,  RUQ discomfort lessened  .  Pending tests in hospital hepatitis neg,  ANA positive but titer negative.  Pt reports separate problem of frequent headaches that occur qod for several months.   Worse while in hospital.  Associated with eye floaters,  Nausea no vomiting,  Light sensitivity.  She take OTC med and "sleeps it off with ice pack" .  No paresthesias no motor weakness, no loss of vision.  Parnter margaret states that her personality is affected when she has headaches,  Increased agitation      Review of Systems     Objective:   Physical Exam Physical Exam  Nursing note and vitals reviewed.  Constitutional: She is oriented to person, place, and time. She appears well-developed and well-nourished.  HENT:  Head: Normocephalic and atraumatic.  Cardiovascular: Normal rate and regular rhythm. Exam reveals no gallop and no friction rub.  No murmur heard.  Pulmonary/Chest: Breath sounds normal. She has no wheezes. She has no rales.  Neurological: She is alert and oriented to person, place, and time.  CNII-XII intact Motor  5/5 aall groups tested Sensory intact to microfilament all extremties Cerbellar intact FTN Reflexes 2+ symmetric Skin: Skin is warm and dry.  Psychiatric: She has a normal mood and  affect. Her behavior is normal.          Assessment & Plan:  Transaminitis with periportal edeam: will recheck lab today.   She has a follow up with Dr. Rhea Belton. Advised if any abd pain to call his office   ITP  Check platelets today   Headaches/ personality change:  Long standing over several months but personality change is newer.   Will start with head CT.  If labs have improved will inititate  Zomig prn

## 2013-04-15 ENCOUNTER — Telehealth: Payer: Self-pay | Admitting: *Deleted

## 2013-04-15 ENCOUNTER — Other Ambulatory Visit: Payer: Self-pay | Admitting: Internal Medicine

## 2013-04-15 ENCOUNTER — Telehealth: Payer: Self-pay | Admitting: Internal Medicine

## 2013-04-15 DIAGNOSIS — R51 Headache: Secondary | ICD-10-CM

## 2013-04-15 LAB — CBC WITH DIFFERENTIAL/PLATELET
Basophils Absolute: 0 10*3/uL (ref 0.0–0.1)
Basophils Relative: 1 % (ref 0–1)
Eosinophils Absolute: 0.1 10*3/uL (ref 0.0–0.7)
Eosinophils Relative: 2 % (ref 0–5)
Lymphs Abs: 0.9 10*3/uL (ref 0.7–4.0)
MCH: 32 pg (ref 26.0–34.0)
MCV: 94.1 fL (ref 78.0–100.0)
Neutrophils Relative %: 70 % (ref 43–77)
Platelets: 118 10*3/uL — ABNORMAL LOW (ref 150–400)
RBC: 4.1 MIL/uL (ref 3.87–5.11)
RDW: 13.3 % (ref 11.5–15.5)

## 2013-04-15 MED ORDER — ZOLMITRIPTAN 2.5 MG PO TBDP: ORAL_TABLET | ORAL | Status: DC

## 2013-04-15 NOTE — Telephone Encounter (Signed)
Brittany Austin had questions about test results. Please call [phone].

## 2013-04-15 NOTE — Telephone Encounter (Signed)
Spoke with pt and informed of Hepatic profile  For her headache will give Zomig ZMT

## 2013-04-15 NOTE — Telephone Encounter (Signed)
Called and spoke with [place]. Gave her appointment information for 04/17/13 at 9:00 am

## 2013-04-16 ENCOUNTER — Encounter: Payer: Self-pay | Admitting: Internal Medicine

## 2013-04-17 ENCOUNTER — Telehealth: Payer: Self-pay | Admitting: *Deleted

## 2013-04-17 ENCOUNTER — Encounter (HOSPITAL_COMMUNITY): Payer: Self-pay | Admitting: *Deleted

## 2013-04-17 ENCOUNTER — Emergency Department (HOSPITAL_COMMUNITY): Payer: BC Managed Care – PPO

## 2013-04-17 ENCOUNTER — Ambulatory Visit (HOSPITAL_BASED_OUTPATIENT_CLINIC_OR_DEPARTMENT_OTHER)
Admission: RE | Admit: 2013-04-17 | Discharge: 2013-04-17 | Disposition: A | Discharge status: Home / Self Care | Payer: BC Managed Care – PPO | Source: Ambulatory Visit | Attending: Internal Medicine | Admitting: Internal Medicine

## 2013-04-17 ENCOUNTER — Emergency Department (HOSPITAL_COMMUNITY)
Admission: EM | Admit: 2013-04-17 | Discharge: 2013-04-18 | Disposition: A | Discharge status: Home / Self Care | Payer: BC Managed Care – PPO | Attending: Emergency Medicine | Admitting: Emergency Medicine

## 2013-04-17 DIAGNOSIS — R51 Headache: Secondary | ICD-10-CM

## 2013-04-17 DIAGNOSIS — Z5189 Encounter for other specified aftercare: Secondary | ICD-10-CM | POA: Insufficient documentation

## 2013-04-17 DIAGNOSIS — R930 Abnormal findings on diagnostic imaging of skull and head, not elsewhere classified: Secondary | ICD-10-CM | POA: Insufficient documentation

## 2013-04-17 DIAGNOSIS — D1802 Hemangioma of intracranial structures: Secondary | ICD-10-CM | POA: Insufficient documentation

## 2013-04-17 DIAGNOSIS — Z8719 Personal history of other diseases of the digestive system: Secondary | ICD-10-CM | POA: Insufficient documentation

## 2013-04-17 DIAGNOSIS — Z862 Personal history of diseases of the blood and blood-forming organs and certain disorders involving the immune mechanism: Secondary | ICD-10-CM | POA: Insufficient documentation

## 2013-04-17 DIAGNOSIS — Z87442 Personal history of urinary calculi: Secondary | ICD-10-CM | POA: Insufficient documentation

## 2013-04-17 DIAGNOSIS — R109 Unspecified abdominal pain: Secondary | ICD-10-CM | POA: Insufficient documentation

## 2013-04-17 DIAGNOSIS — Z79899 Other long term (current) drug therapy: Secondary | ICD-10-CM | POA: Insufficient documentation

## 2013-04-17 DIAGNOSIS — H9190 Unspecified hearing loss, unspecified ear: Secondary | ICD-10-CM | POA: Insufficient documentation

## 2013-04-17 DIAGNOSIS — Z8679 Personal history of other diseases of the circulatory system: Secondary | ICD-10-CM | POA: Insufficient documentation

## 2013-04-17 LAB — CBC WITH DIFFERENTIAL/PLATELET
Basophils Relative: 1 % (ref 0–1)
Eosinophils Absolute: 0.1 10*3/uL (ref 0.0–0.7)
Eosinophils Relative: 2 % (ref 0–5)
HCT: 39.3 % (ref 36.0–46.0)
Hemoglobin: 13.5 g/dL (ref 12.0–15.0)
Lymphs Abs: 1.1 10*3/uL (ref 0.7–4.0)
MCH: 32.5 pg (ref 26.0–34.0)
MCHC: 34.4 g/dL (ref 30.0–36.0)
MCV: 94.7 fL (ref 78.0–100.0)
Monocytes Absolute: 0.4 10*3/uL (ref 0.1–1.0)
Monocytes Relative: 8 % (ref 3–12)
RBC: 4.15 MIL/uL (ref 3.87–5.11)

## 2013-04-17 LAB — BASIC METABOLIC PANEL
BUN: 9 mg/dL (ref 6–23)
Calcium: 10.3 mg/dL (ref 8.4–10.5)
Creatinine, Ser: 0.71 mg/dL (ref 0.50–1.10)
GFR calc Af Amer: >90 mL/min (ref >90)
GFR calc non Af Amer: >90 mL/min (ref >90)
Glucose, Bld: 83 mg/dL (ref 70–99)
Potassium: 4.1 mEq/L (ref 3.5–5.1)

## 2013-04-17 LAB — APTT: aPTT: 29 seconds (ref 24–37)

## 2013-04-17 MED ORDER — GADOBENATE DIMEGLUMINE 529 MG/ML IV SOLN
12.0000 mL | Freq: Once | INTRAVENOUS | Status: AC
  Administered 2013-04-17: 12 mL via INTRAVENOUS

## 2013-04-17 MED ORDER — HYDROCODONE-ACETAMINOPHEN 5-325 MG PO TABS: 2.0000 | ORAL_TABLET | ORAL | Status: DC | PRN

## 2013-04-17 NOTE — ED Notes (Signed)
Pt returned to exam room from MRI. Resting quietly at the time. Vital signs stable. Remains alert and oriented x4. Friends at bedside.

## 2013-04-17 NOTE — ED Provider Notes (Signed)
History    CSN: [id number] Arrival date & time 04/17/13  1033  First MD Initiated Contact with Patient 04/17/13 1036     No chief complaint on file.  (Consider location/radiation/quality/duration/timing/severity/associated sxs/prior Treatment) HPI Comments: Ms. Janvrin is a 37yo F with history of ITP who presents at request of primary physician after CT performed 7/9, read on 7/11 showed concern for intracranial hemorrhage vs. Mass. Patient reports headache for the last week which is not currently present. Endorses epigastric abdominal pain with some radiation to back consistant with pancreatitis diagnosed on admission last week. Denies weakness, tremor, numbness. Denies changes in bowel habits, increased bruising above baseline.  Past Medical History  Diagnosis Date  . ITP (idiopathic thrombocytopenic purpura)   . Hearing loss of both ears   . Kidney stones   . Blood transfusion   . Cysts     scalp and left upper arm  . Rectal bleeding   . Rectal pain   . Bleeding internal hemorrhoids 10/2011    possible anal fissure also seen on office anoscopy   Past Surgical History  Procedure Laterality Date  . Novasure ablation  2009    for dysfunctional uterine bleeding  . Tubal ligation    . Cholecystectomy  2009    for biliary dyskinesia and associated abdominal pain.  done in [place].   . Cesarean section  1999, 2001    x's 2  . Esophagogastroduodenoscopy N/A 04/11/2013    Procedure: ESOPHAGOGASTRODUODENOSCOPY (EGD);  Surgeon: Beverley Fiedler, MD;  Location: [facility] ENDOSCOPY;  Service: Gastroenterology;  Laterality: N/A;   Family History  Problem Relation Age of Onset  . Arthritis    . Colon cancer Paternal Uncle   . Cancer Paternal Uncle     colon  . Hyperlipidemia Mother   . Heart disease Mother   . Hypertension Mother   . Diabetes Mother   . Hyperlipidemia Father   . Heart disease Paternal Grandmother   . Diabetes Paternal Grandmother   . Stroke Maternal Grandmother   . Diabetes  Maternal Grandmother   . Other Maternal Grandmother     brain tumor  . Cancer Paternal Grandfather     unknown source  . Diabetes Sister   . Cancer Paternal Aunt     uterine   History  Substance Use Topics  . Smoking status: Never Smoker   . Smokeless tobacco: Never Used  . Alcohol Use: No   OB History   Grav Para Term Preterm Abortions TAB SAB Ect Mult Living   2 2 2       2      Review of Systems  Gastrointestinal: Positive for abdominal pain. Negative for vomiting and diarrhea.  Neurological: Positive for headaches. Negative for tremors, syncope, speech difficulty, weakness and numbness.    Allergies  Review of patient's allergies indicates no known allergies.  Home Medications   Current Outpatient Rx  Name  Route  Sig  Dispense  Refill  . Cyanocobalamin (VITAMIN B-12 CR PO)   Oral   Take 1 tablet by mouth daily.         Marland Kitchen esomeprazole (NEXIUM) 20 MG capsule   Oral   Take 20 mg by mouth daily before breakfast.         . Multiple Vitamin (MULTIVITAMIN) tablet   Oral   Take 1 tablet by mouth daily.         . polyethylene glycol (MIRALAX / GLYCOLAX) packet   Oral   Take 17 g by  mouth daily as needed.         . zolmitriptan (ZOMIG ZMT) 2.5 MG disintegrating tablet      Take one tablet at onset of headache.  May repeat in 2 hours one time only   10 tablet   0    BP 129/65  Pulse 79  Temp(Src) 98.4 F (36.9 C) (Oral)  SpO2 100%  LMP 04/07/2013 Physical Exam  Constitutional: She is oriented to person, place, and time. She appears well-developed and well-nourished. No distress.  HENT:  Head: Normocephalic and atraumatic.  Right Ear: Hearing normal.  Left Ear: Hearing normal.  Nose: Nose normal.  Mouth/Throat: Oropharynx is clear and moist and mucous membranes are normal.  Eyes: Conjunctivae and EOM are normal. Pupils are equal, round, and reactive to light.  Neck: Normal range of motion. Neck supple.  Cardiovascular: Regular rhythm, S1 normal  and S2 normal.  Exam reveals no gallop and no friction rub.   No murmur heard. Pulmonary/Chest: Effort normal and breath sounds normal. No respiratory distress. She exhibits no tenderness.  Abdominal: Soft. Normal appearance and bowel sounds are normal. There is no hepatosplenomegaly. There is no tenderness. There is no rebound, no guarding, no tenderness at McBurney's point and negative Murphy's sign. No hernia.  Musculoskeletal: Normal range of motion.  Neurological: She is alert and oriented to person, place, and time. She has normal strength. No cranial nerve deficit or sensory deficit. Coordination normal. GCS eye subscore is 4. GCS verbal subscore is 5. GCS motor subscore is 6.  Skin: Skin is warm, dry and intact. No rash noted. No cyanosis.  Psychiatric: She has a normal mood and affect. Her speech is normal and behavior is normal. Thought content normal.    ED Course  Procedures (including critical care time) Labs Reviewed  CBC WITH DIFFERENTIAL - Abnormal; Notable for the following:    Platelets 92 (*)    All other components within normal limits  BASIC METABOLIC PANEL  PROTIME-INR  APTT   Ct Head Wo Contrast  04/17/2013   *RADIOLOGY REPORT*  Clinical Data: Headache  CT HEAD WITHOUT CONTRAST  Technique:  Contiguous axial images were obtained from the base of the skull through the vertex without contrast.  Comparison: None.  Findings: Bony calvarium appears intact.  No mass effect or midline shift is noted.  Ventricular size is within normal limits. There is an oval shaped hyperdensity seen in the left periventricular white matter of the measures 7 x 4 mm.  This is concerning for possible hemorrhage or mass and MRI scan is recommended for further evaluation.  IMPRESSION: Oval shaped hyperdensity seen in left periventricular white matter concerning for possible hemorrhage or mass, and immediate MRI scan is recommended for further evaluation.  Dr. Lonell Face office was called but she was  not there today; report was given to Perry Mount, a nurse at the office, and she stated that she will contact physician and the patient to schedule immediate MRI.  Critical Value/emergent results were called by telephone at the time of interpretation on April 17, 2013 at 09:30 a.m. to Eather Colas, who verbally acknowledged these results.   Original Report Authenticated By: Lupita Raider.,  M.D.    MR Brain W Wo Contrast (Final result)  Result time: 04/17/13 12:45:39    Final result by Rad Results In Interface (04/17/13 12:45:39)    Narrative:   *RADIOLOGY REPORT*  Clinical Data: Severe headache, 1 week duration. Abnormal head CT.  MRI HEAD WITHOUT AND WITH  CONTRAST  Technique: Multiplanar, multiecho pulse sequences of the brain and surrounding structures were obtained according to standard protocol without and with intravenous contrast  Contrast: 12mL MULTIHANCE GADOBENATE DIMEGLUMINE 529 MG/ML IV SOLN  Comparison: Head CT same day  Findings: The brainstem and cerebellum are normal. There is a 7 mm focus in the left parietal white matter consistent with a cavernous angioma. There is hemosiderin deposition in this region. No evidence of regional edema. The remainder the brain appears normal. 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  No neoplastic mass lesion, hydrocephalus or extra-axial collection. No evidence of ischemic infarction. No pituitary mass. No inflammatory sinus disease. No skull or skull base lesion.  IMPRESSION: The hyperdensity in the left parietal white matter at CT relates to a mixed developmental venous abnormality. There is a cavernous angioma in this area measuring 7 mm responsible for the CT finding. There is hemosiderin deposition but no definable acute hemorrhage. No edema. Adjacent to this, there are some prominent  angioma type veins. I cannot establish if this abnormality relates to the recent episode of headache.   Original Report Authenticated By: Paulina Fusi, M.D.      Diagnosis: 1. Venous malformation, brain 2. Headache  MDM  Patient presents to ER for evaluation of headache. Patient was scheduled for a CT scan by primary doctor and there was concern over possible acute bleed. Patient was sent to the ER. Based on the fact that the patient has ITP, acute bleeding is of concern. 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 This was discussed with neurosurgery. It was felt that the patient did not require any emergent interventions, even with the low platelets. It was recommended that the patient be discharged, followup with Doctor Franky Macho in the office.  Patient is been prescribed Zomig to be used for her headaches. As it was felt that the headaches were caused by this small venous malformation, I did consult neurology, Doctor Amada Jupiter, to see if she could safely use the Zomig. He felt that this was safe with the venous malformation.  Discharge with outpatient followup instructions and return instructions.  Gilda Crease, MD 04/21/13 [phone]

## 2013-04-17 NOTE — ED Notes (Signed)
Dr. Constance Goltz called and reports that radiologist called her and told her that pt. 's CT scan showed possibly a small bleed in the brain and that she needs a MRI>  SEnt by POV.  Pt. Is stable and Dr. Constance Goltz denies that pt. Has any  Neurological  Symptoms.

## 2013-04-17 NOTE — ED Notes (Signed)
Patient states she started having headache while she was a patient for abdominal pain. Patietn states her primary doctor sent her for CT scan this morning and a small bleed was detected and patient was sent to ED.

## 2013-04-17 NOTE — Telephone Encounter (Signed)
Received phone call at 09:30 from Dr Fayrene Fearing green regarding emergent head CT results. Dr. Chilton Si reports a possible hemorrhage vs mass and reccommended an immediate   MRI. Dr Constance Goltz contacted at 09:33 with results. Dr Frazier Butt contacted pt at 09:55 with results and instructions to go immediately to [facility] ER for ED admission and MRI. Pt voiced understanding and will go to [facility].

## 2013-04-17 NOTE — ED Notes (Signed)
Urine sample collected if needed. 

## 2013-04-22 ENCOUNTER — Ambulatory Visit (INDEPENDENT_AMBULATORY_CARE_PROVIDER_SITE_OTHER): Payer: BC Managed Care – PPO | Admitting: Internal Medicine

## 2013-04-22 ENCOUNTER — Encounter: Payer: Self-pay | Admitting: Internal Medicine

## 2013-04-22 VITALS — BP 113/75 | HR 81 | Temp 97.8°F | Resp 16 | Wt 130.0 lb

## 2013-04-22 DIAGNOSIS — D1802 Hemangioma of intracranial structures: Secondary | ICD-10-CM | POA: Insufficient documentation

## 2013-04-22 DIAGNOSIS — D693 Immune thrombocytopenic purpura: Secondary | ICD-10-CM

## 2013-04-22 DIAGNOSIS — R51 Headache: Secondary | ICD-10-CM

## 2013-04-22 NOTE — Patient Instructions (Signed)
See me as needed   Keep appt with neurosurgeon  No ASA or NSAID products

## 2013-04-22 NOTE — Progress Notes (Signed)
Subjective:    Patient ID: Brittany Austin, female    DOB: 1975/11/19, 37 y.o.   MRN: [id number]  HPI Brittany Austin is here for  ER follow up with her partner [name].    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 Mikal Plane pending.  Pt reports over that past few weeks she has noticed when getting up her vision will sometimes be "dark" but resolves when she walks across the room.  She tripped when walking across rug today.    No headaches since ER visit but small headache this am.  She has not tried her Zomig  No Known Allergies Past Medical History  Diagnosis Date  . ITP (idiopathic thrombocytopenic purpura)   . Hearing loss of both ears   . Kidney stones   . Blood transfusion   . Cysts     scalp and left upper arm  . Rectal bleeding   . Rectal pain   . Bleeding internal hemorrhoids 10/2011    possible anal fissure also seen on office anoscopy   Past Surgical History  Procedure Laterality Date  . Novasure ablation  2009    for dysfunctional uterine bleeding  . Tubal ligation    . Cholecystectomy  2009    for biliary dyskinesia and associated abdominal pain.  done in [place].   . Cesarean section  1999, 2001    x's 2  . Esophagogastroduodenoscopy N/A 04/11/2013    Procedure: ESOPHAGOGASTRODUODENOSCOPY (EGD);  Surgeon: Beverley Fiedler, MD;  Location: [facility] ENDOSCOPY;  Service: Gastroenterology;  Laterality: N/A;   History   Social History  . Marital Status: Single    Spouse Name: N/A    Number of Children: 2  . Years of Education: N/A   Occupational History  . 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 Smoking status: Never Smoker   . Smokeless tobacco: Never Used  . Alcohol Use: No  . Drug Use: No  . Sexually Active: Not Currently   Other Topics Concern  . Not on file   Social History Narrative  . No narrative on file   Family History  Problem Relation Age of Onset  . Arthritis     . Colon cancer Paternal Uncle   . Cancer Paternal Uncle     colon  . Hyperlipidemia Mother   . Heart disease Mother   . Hypertension Mother   . Diabetes Mother   . Hyperlipidemia Father   . Heart disease Paternal Grandmother   . Diabetes Paternal Grandmother   . Stroke Maternal Grandmother   . Diabetes Maternal Grandmother   . Other Maternal Grandmother     brain tumor  . Cancer Paternal Grandfather     unknown source  . Diabetes Sister   . Cancer Paternal Aunt     uterine   Patient Active Problem List   Diagnosis Date Noted  . Intracranial cavernous or venous angioma 04/22/2013  . Headache(784.0) 04/14/2013  . Transaminitis 04/14/2013  . Pancreatitis 04/07/2013  . Abdominal pain, epigastric 04/07/2013  . Dysmenorrhea 01/29/2013  . 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 ITP (idiopathic thrombocytopenic purpura) 11/05/2011  . Hearing loss of both ears 11/05/2011   Current Outpatient Prescriptions on File Prior to Visit  Medication Sig Dispense Refill  . esomeprazole (NEXIUM) 20 MG capsule Take 20 mg by mouth daily before breakfast.      . Multiple Vitamin (MULTIVITAMIN) tablet Take  1 tablet by mouth daily.      . polyethylene glycol (MIRALAX / GLYCOLAX) packet Take 17 g by mouth daily as needed (constipation).       . zolmitriptan (ZOMIG ZMT) 2.5 MG disintegrating tablet Take one tablet at onset of headache.  May repeat in 2 hours one time only  10 tablet  0   No current facility-administered medications on file prior to visit.       Review of Systems See HPI    Objective:   Physical Exam  Physical Exam  Nursing note and vitals reviewed.  Constitutional: She is oriented to person, place, and time. She appears well-developed and well-nourished.  HENT:  Head: Normocephalic and atraumatic.  Cardiovascular: Normal rate and regular rhythm. Exam reveals no gallop and no friction rub.  No murmur heard.  Pulmonary/Chest: Breath sounds normal. She has no wheezes. She has no rales.   Neurological: She is alert and oriented to person, place, and time.  CNII-XII intact Motor 5/5 UE and LE Reflexes  2+ symmetric  UE and LE Sensory intact to microfilament Cerebellar  Intact FTN Skin: Skin is warm and dry.  Psychiatric: She has a normal mood and affect. Her behavior is normal.            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  ADvised to take Tylenol for headache  No ASA or NSAIDS for now  Visual change when standing  Neurologic exam non focal.  No change in management at this time  ITP  Platelets improving

## 2013-05-11 ENCOUNTER — Other Ambulatory Visit: Payer: Self-pay

## 2013-05-11 ENCOUNTER — Telehealth: Payer: Self-pay | Admitting: *Deleted

## 2013-05-11 ENCOUNTER — Other Ambulatory Visit: Payer: Self-pay | Admitting: Internal Medicine

## 2013-05-11 NOTE — Telephone Encounter (Signed)
Spoke with Dr Constance Goltz, and she said no lab work needs to be done.  Brittany Austin needs to see GI doctor for her abdominal pain.  If pain get unbearable before her appointment, she needs to be seen in ER or Urgent Care.

## 2013-05-11 NOTE — Progress Notes (Signed)
Faxed orders to [facility] at fax # [phone] for CBC/diff, Hepatic Panel.  Patient was there at the lab.  Dx: 790.6

## 2013-05-11 NOTE — Telephone Encounter (Signed)
Brittany Austin called wanting to know if more lab work could be ordered for [employer].  Said she is still having abdominal pain and headaches. She has her first GI appointment next week.

## 2013-05-12 LAB — HEPATIC FUNCTION PANEL
Albumin: 4.9 g/dL (ref 3.5–5.2)
Alkaline Phosphatase: 52 U/L (ref 39–117)
Bilirubin, Direct: 0.1 mg/dL (ref 0.0–0.3)
Indirect Bilirubin: 0.5 mg/dL (ref 0.0–0.9)
Total Bilirubin: 0.6 mg/dL (ref 0.3–1.2)

## 2013-05-12 LAB — CBC WITH DIFFERENTIAL/PLATELET
Hemoglobin: 12.9 g/dL (ref 12.0–15.0)
Lymphocytes Relative: 24 % (ref 12–46)
Lymphs Abs: 0.9 10*3/uL (ref 0.7–4.0)
Neutrophils Relative %: 68 % (ref 43–77)
Platelets: 99 10*3/uL — ABNORMAL LOW (ref 150–400)
RBC: 4.08 MIL/uL (ref 3.87–5.11)
WBC: 3.9 10*3/uL — ABNORMAL LOW (ref 4.0–10.5)

## 2013-05-13 NOTE — Progress Notes (Signed)
Quick Note:  Liver tests back to normal Let her know please Will discuss further at rev ______

## 2013-05-14 ENCOUNTER — Telehealth: Payer: Self-pay | Admitting: Internal Medicine

## 2013-05-14 NOTE — Telephone Encounter (Signed)
Patient called to cx her appointment she says she is transferring care to [facility].

## 2013-05-20 ENCOUNTER — Ambulatory Visit: Payer: BC Managed Care – PPO | Admitting: Internal Medicine

## 2013-05-22 ENCOUNTER — Ambulatory Visit: Payer: BC Managed Care – PPO | Admitting: Internal Medicine

## 2013-05-27 DIAGNOSIS — Q283 Other malformations of cerebral vessels: Secondary | ICD-10-CM | POA: Insufficient documentation

## 2013-08-13 ENCOUNTER — Other Ambulatory Visit: Payer: Self-pay

## 2014-02-13 IMAGING — CT CT HEAD W/O CM
1 series · 15 of 30 positions shown, 19 images · non-contrast
Comparison: None.

CLINICAL DATA: Headache

CT HEAD WITHOUT CONTRAST
TECHNIQUE: Contiguous axial images were obtained from the base of
the skull through the vertex without contrast.

[Series 2: head 4.8 h37s · axial · 0.45mm/px · z∈[+991,+1124]mm · 15 of 32 slices shown, 19 images]
[im 2/32  brain]
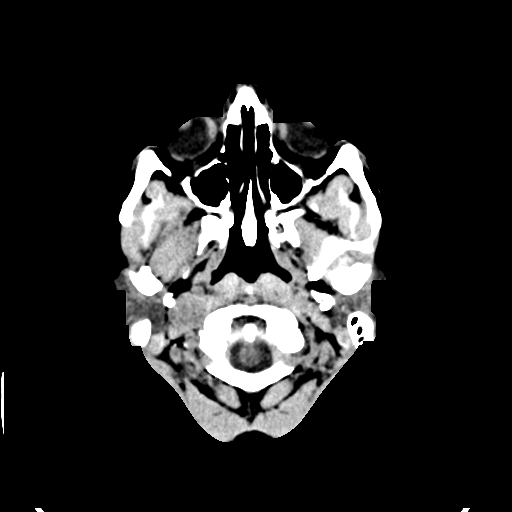
[im 2/32  bone]
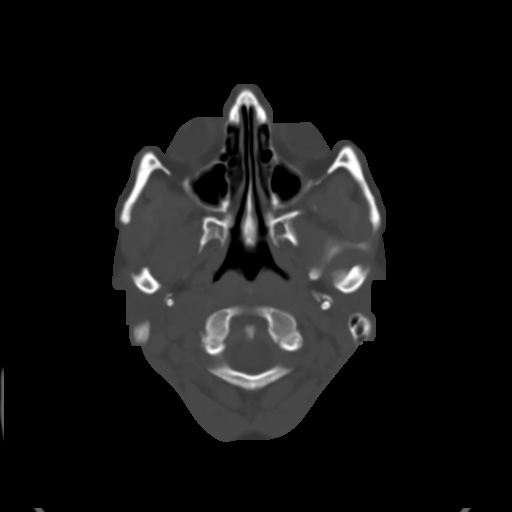
[im 4/32  brain]
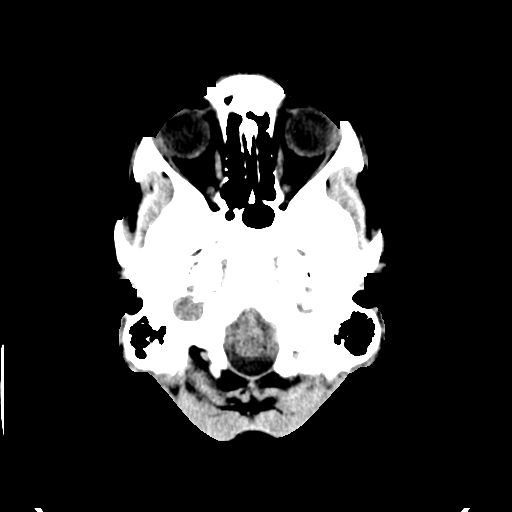
[im 6/32  brain]
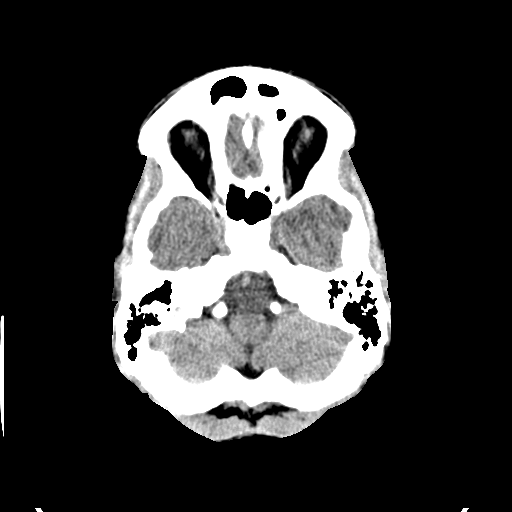
[im 8/32  brain]
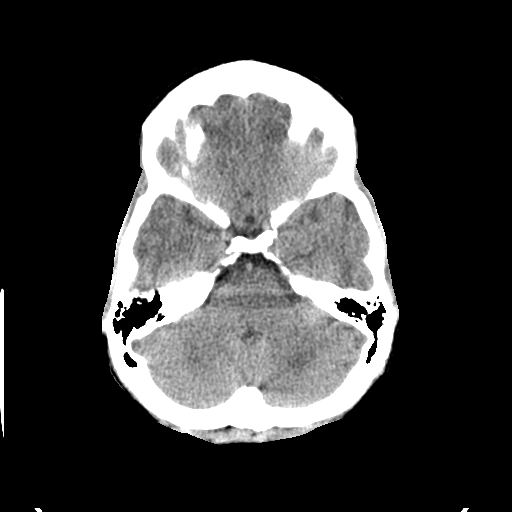
[im 10/32  brain]
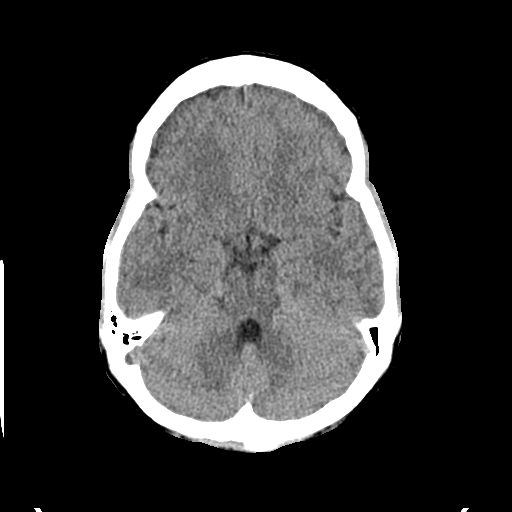
[im 10/32  bone]
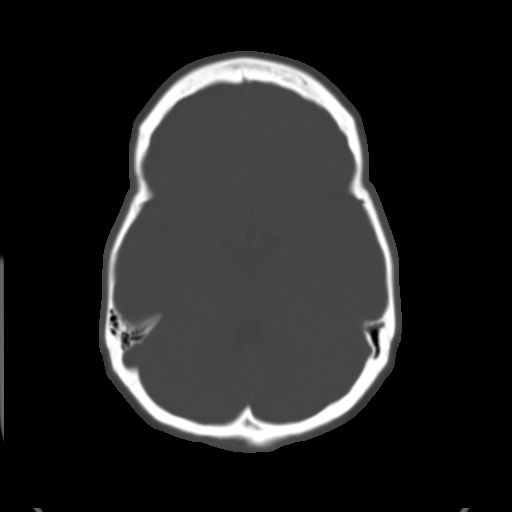
[im 12/32  brain]
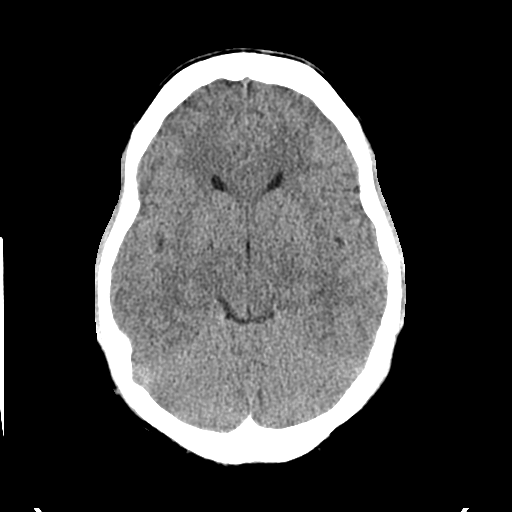
[im 14/32  brain]
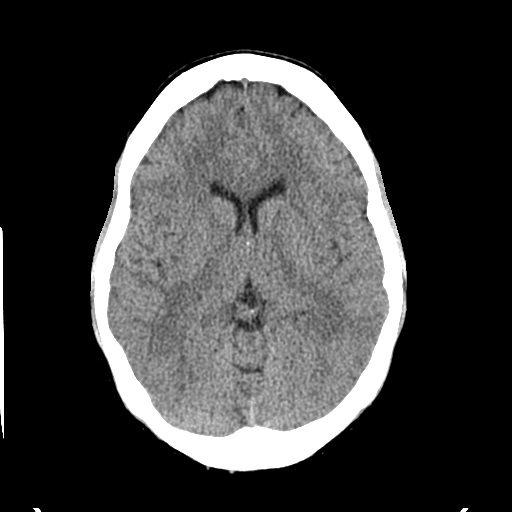
[im 17/32  brain]
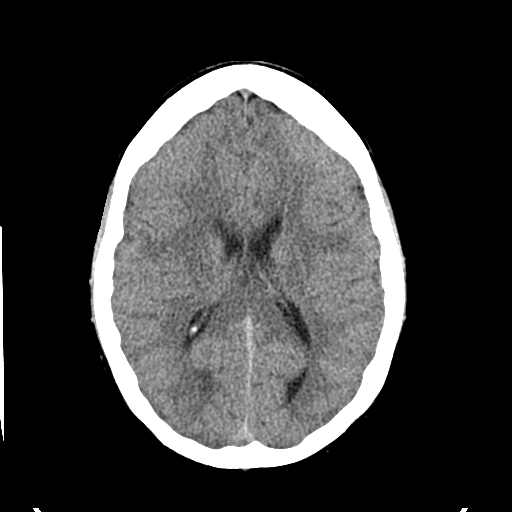
[im 18/32  brain]
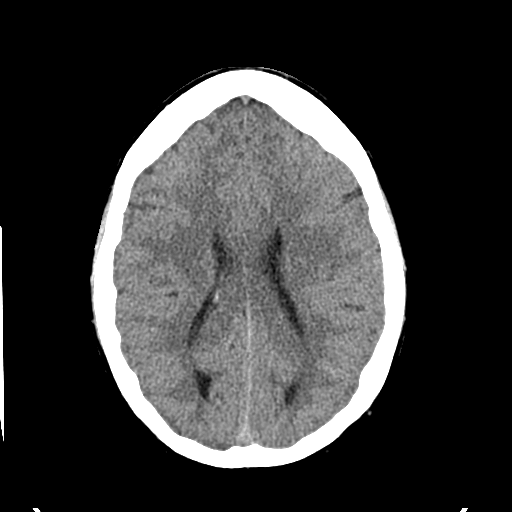
[im 18/32  bone]
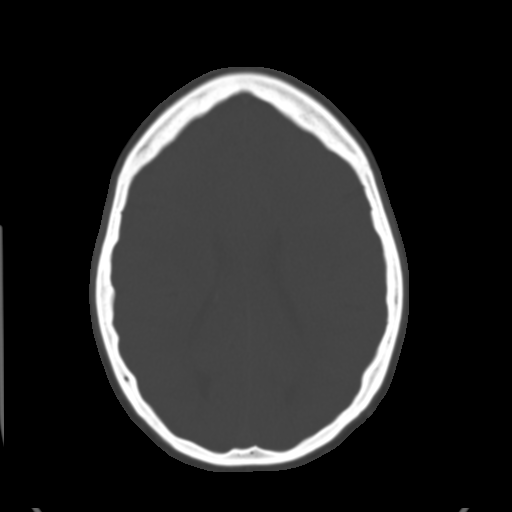
[im 20/32  brain]
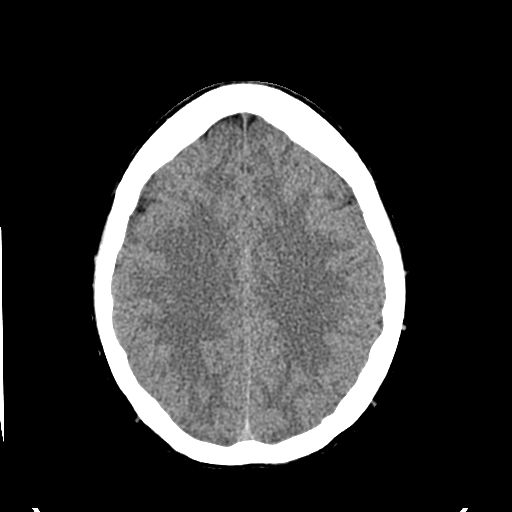
[im 22/32  brain]
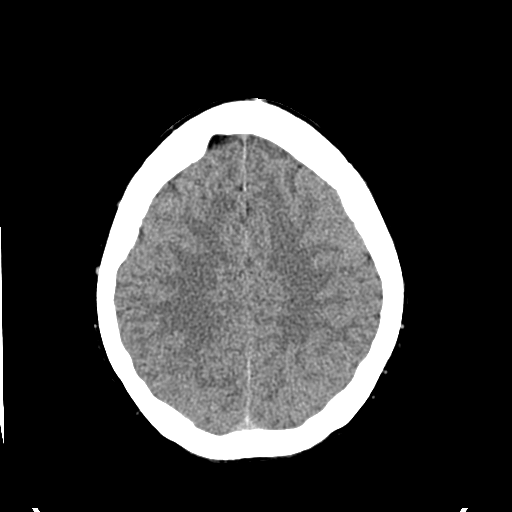
[im 24/32  brain]
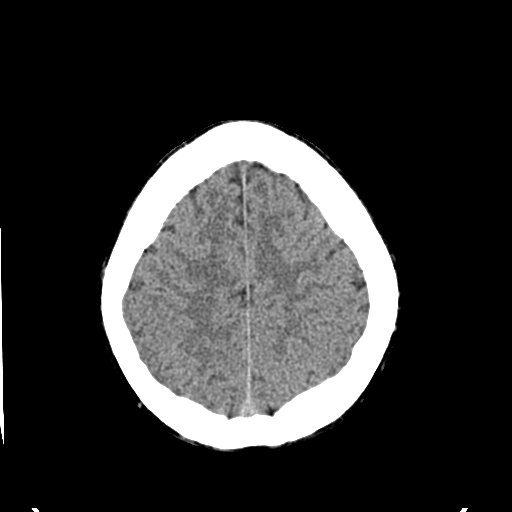
[im 26/32  brain]
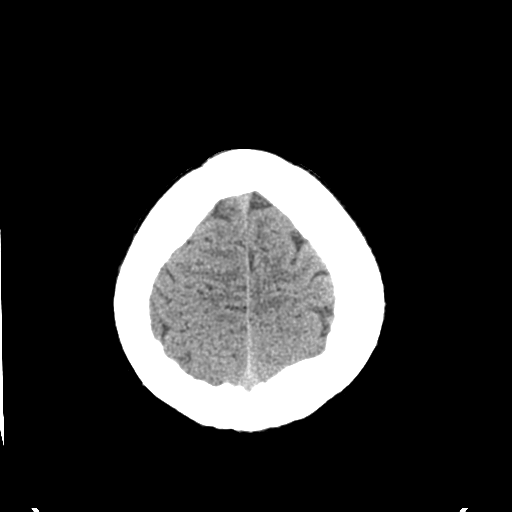
[im 26/32  bone]
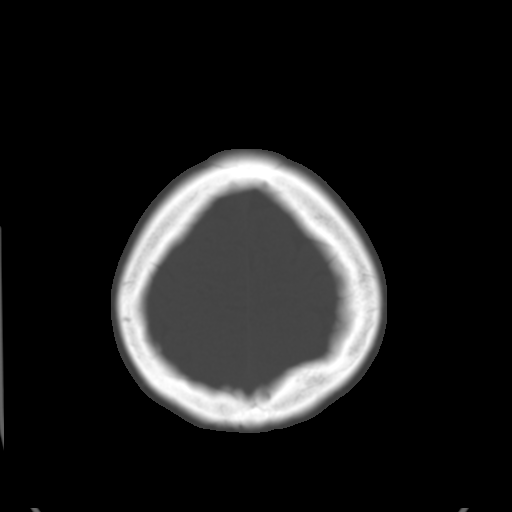
[im 28/32  brain]
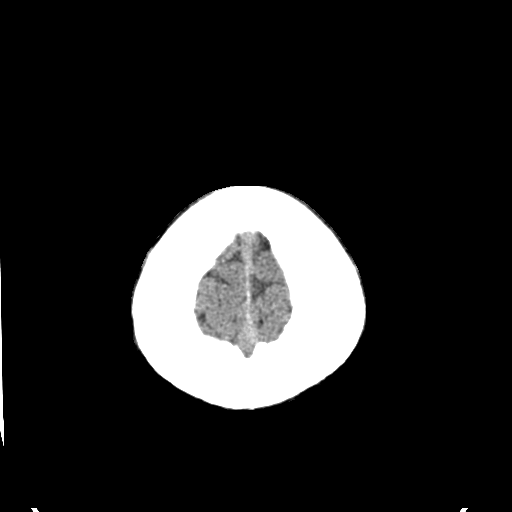
[im 30/32  brain]
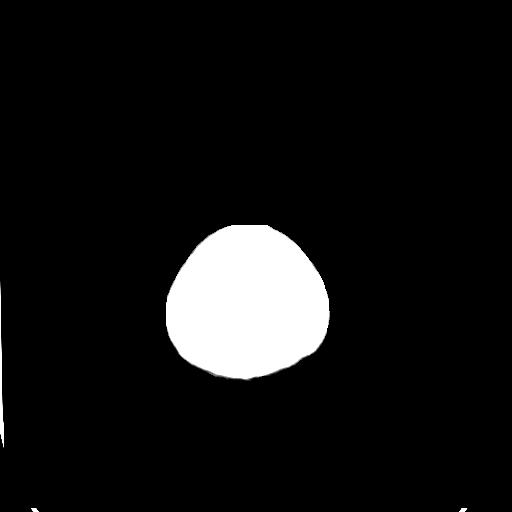

[15 of 30 positions shown; findings below may reference images not displayed]

FINDINGS: Bony calvarium appears intact.  No mass effect or midline
shift is noted.  Ventricular size is within normal limits. There is
an oval shaped hyperdensity seen in the left periventricular white
matter of the measures 7 x 4 mm.  This is concerning for possible
hemorrhage or mass and MRI scan is recommended for further
evaluation.
IMPRESSION: Oval shaped hyperdensity seen in left periventricular white matter
concerning for possible hemorrhage or mass, and immediate MRI scan
is recommended for further evaluation.  Dr. [REDACTED] was
called but she was not there today; report was given to [place] nurse at the office, and she stated that she will
contact physician and the patient to schedule immediate MRI.

Critical Value/emergent results were called by telephone at the
time of interpretation on April 17, 2013 at [DATE] a.m. to Hougaard, Shakier
[place], who verbally acknowledged these results.

## 2014-09-27 DIAGNOSIS — G43909 Migraine, unspecified, not intractable, without status migrainosus: Secondary | ICD-10-CM | POA: Insufficient documentation

## 2020-09-22 ENCOUNTER — Other Ambulatory Visit: Payer: BC Managed Care – PPO

## 2021-03-10 DIAGNOSIS — N62 Hypertrophy of breast: Secondary | ICD-10-CM | POA: Insufficient documentation

## 2021-09-11 ENCOUNTER — Encounter: Payer: Self-pay | Admitting: *Deleted

## 2021-09-26 ENCOUNTER — Ambulatory Visit: Payer: BC Managed Care – PPO | Admitting: Internal Medicine

## 2021-09-26 ENCOUNTER — Encounter: Payer: Self-pay | Admitting: Internal Medicine

## 2021-09-26 ENCOUNTER — Other Ambulatory Visit: Payer: BC Managed Care – PPO

## 2021-09-26 VITALS — BP 108/68 | HR 74 | Ht 59.0 in | Wt 110.0 lb

## 2021-09-26 DIAGNOSIS — R1011 Right upper quadrant pain: Secondary | ICD-10-CM

## 2021-09-26 DIAGNOSIS — Z1159 Encounter for screening for other viral diseases: Secondary | ICD-10-CM | POA: Diagnosis not present

## 2021-09-26 DIAGNOSIS — K5909 Other constipation: Secondary | ICD-10-CM | POA: Diagnosis not present

## 2021-09-26 DIAGNOSIS — R1032 Left lower quadrant pain: Secondary | ICD-10-CM | POA: Diagnosis not present

## 2021-09-26 LAB — HEPATITIS C ANTIBODY
Hepatitis C Ab: NONREACTIVE
SIGNAL TO CUT-OFF: <0.02 (ref <1.00)

## 2021-09-26 NOTE — Patient Instructions (Signed)
Your provider has requested that you go to the basement level for lab work before leaving today. Press "B" on the elevator. The lab is located at the first door on the left as you exit the elevator.  You have been scheduled for a CT scan of the abdomen and pelvis at [facility] ([address]---this is in the same building as [employer]).   You are scheduled on 10/12/2021 at 11:30am. You should arrive 15 minutes prior to your appointment time for registration. Please follow the written instructions below on the day of your exam:  WARNING: IF YOU ARE ALLERGIC TO IODINE/X-RAY DYE, PLEASE NOTIFY RADIOLOGY IMMEDIATELY AT [phone]! YOU WILL BE GIVEN A 13 HOUR PREMEDICATION PREP.  1) Do not eat or drink anything after 7:30am (4 hours prior to your test)    You may take any medications as prescribed with a small amount of water, if necessary. If you take any of the following medications: METFORMIN, GLUCOPHAGE, GLUCOVANCE, AVANDAMET, RIOMET, FORTAMET, ACTOPLUS MET, JANUMET, GLUMETZA or METAGLIP, you MAY be asked to HOLD this medication 48 hours AFTER the exam.   This test typically takes 30-45 minutes to complete.  If you have any questions regarding your exam or if you need to reschedule, you may call the CT department at [phone] between the hours of 8:00 am and 5:00 pm, Monday-Friday.  ________________________________________________________________________ If you are age 9 or older, your body mass index should be between 23-30. Your Body mass index is 22.22 kg/m. If this is out of the aforementioned range listed, please consider follow up with your Primary Care Provider.  If you are age 68 or younger, your body mass index should be between 19-25. Your Body mass index is 22.22 kg/m. If this is out of the aformentioned range listed, please consider follow up with your Primary Care Provider.   ________________________________________________________  The   GI providers would like to encourage you to use [facility] to communicate with providers for non-urgent requests or questions.  Due to long hold times on the telephone, sending your provider a message by [facility] may be a faster and more efficient way to get a response.  Please allow 48 business hours for a response.  Please remember that this is for non-urgent requests.  _______________________________________________________ Due to recent changes in healthcare laws, you may see the results of your imaging and laboratory studies on MyChart before your provider has had a chance to review them.  We understand that in some cases there may be results that are confusing or concerning to you. Not all laboratory results come back in the same time frame and the provider may be waiting for multiple results in order to interpret others.  Please give [place] 48 hours in order for your provider to thoroughly review all the results before contacting the office for clarification of your results.

## 2021-09-26 NOTE — Progress Notes (Signed)
Patient ID: Brittany Austin, female   DOB: [birth year], 45 y.o.   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  She is here today with her wife.  She is known to me from evaluation in 2014 for epigastric abdominal pain.  She had an upper endoscopy which I performed on 04/11/2013.  This was normal.  Biopsies of the stomach showed mild chronic gastritis without H. pylori.  She had an MRI abdomen with MRCP in 2014 to evaluate abdominal pain with elevated liver enzymes.  She had had a prior cholecystectomy and so I wanted to rule out retained common duct stone.  This showed possible compression of the celiac origin raising the question of median arcuate ligament syndrome.  She reports that she is having right upper quadrant abdominal pain over the last 6 months.  This comes and goes.  She has a hard time knowing if this starts in the right upper quadrant and radiates into the right back or vice versa.  Gets better if she lies down.  Feels like a tightness and squeezing type pain.  Does not associate with eating.  Does not have nausea or vomiting.  Separate from this she has a left lower and lower abdominal pain which is less frequent.  This is achy in nature.  There are no specific aggravating or alleviating factors for this pain.  Her bowel movements are at times hard and she reports using magnesium 400 to 800 mg daily to help with constipation.  She does find it effective though she can still skip 3 to 4 days without bowel movement.  This does not necessarily cause worsening of the abdominal pain.  Her left lower pain does not necessarily get better with bowel movement.  At times she will have the urge for defecation without bowel movement.  She does feel swollen hemorrhoids and at times sees  bright red blood with wiping.  Denies blood in stool or in the toilet water.  She started Ozempic about a year and a half ago and is lost nearly 90 pounds.  The dose has recently been lowered.  She has had prior cholecystectomy as well as cesarean sections.  Her wife just had a positive hepatitis C antibody but we do not yet know if she has active hepatitis C.  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  done in [place].    ESOPHAGOGASTRODUODENOSCOPY N/A 04/11/2013   Procedure: ESOPHAGOGASTRODUODENOSCOPY (EGD);  Surgeon: Jerene Bears, MD;  Location: [facility];  Service: Gastroenterology;  Laterality: N/A;   [place]  2009   for dysfunctional uterine bleeding   TUBAL LIGATION  Outpatient Medications Prior to Visit  Medication Sig Dispense Refill   Cyanocobalamin (VITAMIN B 12 PO) Take by mouth.     MAGNESIUM PO Take by mouth.     Multiple Vitamin (MULTIVITAMIN) tablet Take 1 tablet by mouth daily.     naproxen (NAPROSYN) 500 MG tablet TAKE 1 TABLET(500 MG) BY MOUTH TWICE DAILY WITH MEALS     OZEMPIC, 1 MG/DOSE, 4 MG/3ML SOPN Inject 1 mg into the skin once a week.     tiZANidine (ZANAFLEX) 4 MG tablet Take 4 mg by mouth 2 (two) times daily.     esomeprazole (NEXIUM) 20 MG capsule Take 20 mg by mouth daily before breakfast.     phenyltoloxamine-acetaminophen 30-325 MG per tablet Take 1 tablet by mouth every 4 (four) hours as needed for pain.      polyethylene glycol (MIRALAX / GLYCOLAX) packet Take 17 g by mouth daily as needed (constipation).      zolmitriptan (ZOMIG ZMT) 2.5 MG disintegrating tablet Take one tablet at onset of headache.  May repeat in 2 hours one time only 10 tablet 0   No facility-administered medications prior to visit.    No Known Allergies  Family History  Problem Relation Age of Onset   Hyperlipidemia Mother    Heart disease Mother    Hypertension Mother    Diabetes Mother    Hyperlipidemia Father    Diabetes Sister    Stroke Maternal Grandmother    Diabetes Maternal Grandmother    Other Maternal Grandmother        brain tumor   Heart disease Paternal Grandmother    Diabetes Paternal Grandmother    Cancer Paternal Grandmother    Cancer Paternal Grandfather        unknown source   Uterine cancer Paternal Aunt    Colon cancer Paternal Uncle    Arthritis Other     Social History   Tobacco Use   Smoking status: Never   Smokeless tobacco: Never  Substance Use Topics   Alcohol use: No   Drug use: No    ROS: As per history of present illness, otherwise negative  BP 108/68    Pulse 74    Ht 4' 11"  (1.499 m)    Wt 110 lb (49.9 kg)    SpO2 99%    BMI 22.22 kg/m  Gen: awake, alert, NAD HEENT: anicteric  CV: RRR, no mrg Pulm: CTA b/l Abd: soft, mild tenderness in the right upper and left lower abdomen without rebound or guarding, nondistended, +BS throughout Ext: no c/c/e Neuro: nonfocal   RELEVANT LABS AND IMAGING: Lab work reviewed from the [facility] dated October 2022: CMP within normal limits, AST 14, ALT 10, total bili 0.6, alk phos 42, albumin 4.8 Creatinine 0.84 Lipase normal Hemoglobin 12.4, MCV 96.4, white count 5.6, platelet count 93  INDICATION: \ R10.9 Abdominal pain, unspecified abdominal location  ADDITIONAL HISTORY: None.  COMPARISON: 01/04/2020   TECHNIQUE: Multi-planar real-time ultrasonography of the upper abdomen (right upper quadrant) using grayscale  imaging, supplemented by color, power, and spectral Doppler as needed.   FINDINGS:   . Vascular: The inferior vena cava is patent. The visualized aorta is unremarkable.  .  Pancreas: Incompletely imaged. Visualized portions are unremarkable.  .  Liver: The liver measures 14.4 cm in its craniocaudal dimension. Normal size, contour, and echotexture. No focal lesions. The portal and hepatic venous systems are patent with antegrade flow.  .  Gallbladder: Surgically absent.  .  Biliary: The  maximum caliber of the visualized common bile duct is 6 mm. No intrahepatic or extrahepatic ductal dilatation.  .  Right kidney: The right kidney measures 10.7 cm in length. Normal size, contour, and echogenicity. No hydronephrosis or perinephric fluid. No focal mass is identified.  .  Peritoneum: No ascites.  .  Additional comments: None. Procedure Note  Diamantina Providence, MD - 08/01/2021  Formatting of this note might be different from the original.  US ABDOMEN LIMITED, 08/01/2021 8:41 AM   INDICATION: \ R10.9 Abdominal pain, unspecified abdominal location  ADDITIONAL HISTORY: None.  COMPARISON: 01/04/2020   TECHNIQUE: Multi-planar real-time ultrasonography of the upper abdomen (right upper quadrant) using grayscale imaging, supplemented by color, power, and spectral Doppler as needed.   FINDINGS:   . Vascular: The inferior vena cava is patent. The visualized aorta is unremarkable.  .  Pancreas: Incompletely imaged. Visualized portions are unremarkable.  .  Liver: The liver measures 14.4 cm in its craniocaudal dimension. Normal size, contour, and echotexture. No focal lesions. The portal and hepatic venous systems are patent with antegrade flow.  .  Gallbladder: Surgically absent.  .  Biliary: The maximum caliber of the visualized common bile duct is 6 mm. No intrahepatic or extrahepatic ductal dilatation.  .  Right kidney: The right kidney measures 10.7 cm in length. Normal size, contour, and  echogenicity. No hydronephrosis or perinephric fluid. No focal mass is identified.  .  Peritoneum: No ascites.  .  Additional comments: None.   CONCLUSION:   Normal ultrasound of the upper abdomen. 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 Arlean Hopping  to evaluate right upper quadrant pain, lower and left lower abdominal pain and constipation.   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  Constipation may explain the majority of her symptoms but we need to exclude other possibilities.  We briefly discussed upper and lower endoscopy which I will reserve for unresolved symptoms or abnormality on cross-sectional imaging --CT angiography abdomen pelvis --Consider trial of Trulance if CT unremarkable  2.  Constipation --chronic and longstanding.  She is using magnesium as a laxative.  It seems incompletely effective. --Await imaging --Can continue magnesium for now --Consider Trulance if CT unremarkable  3.  Hepatitis C screening --patient's wife has a positive hepatitis C antibody, we will check viral hepatitis antibody today  4.  Colon cancer screening --colonoscopy recommended when she turns 45 years old unless warranted sooner as above   [id number], Altamese Cabal, Md [place],  [place] [address]

## 2021-10-12 ENCOUNTER — Ambulatory Visit (INDEPENDENT_AMBULATORY_CARE_PROVIDER_SITE_OTHER)
Admission: RE | Admit: 2021-10-12 | Discharge: 2021-10-12 | Disposition: A | Discharge status: Home / Self Care | Payer: BC Managed Care – PPO | Source: Ambulatory Visit | Attending: Internal Medicine | Admitting: Internal Medicine

## 2021-10-12 ENCOUNTER — Other Ambulatory Visit: Payer: Self-pay

## 2021-10-12 DIAGNOSIS — R1011 Right upper quadrant pain: Secondary | ICD-10-CM | POA: Diagnosis not present

## 2021-10-12 DIAGNOSIS — R1032 Left lower quadrant pain: Secondary | ICD-10-CM

## 2021-10-12 MED ORDER — IOHEXOL 350 MG/ML SOLN
80.0000 mL | Freq: Once | INTRAVENOUS | Status: AC | PRN
  Administered 2021-10-12: 80 mL via INTRAVENOUS

## 2021-10-13 ENCOUNTER — Other Ambulatory Visit: Payer: Self-pay

## 2021-10-13 DIAGNOSIS — I771 Stricture of artery: Secondary | ICD-10-CM

## 2021-10-19 ENCOUNTER — Encounter: Payer: Self-pay | Admitting: Internal Medicine

## 2021-10-24 ENCOUNTER — Encounter: Payer: BC Managed Care – PPO | Admitting: Vascular Surgery

## 2021-10-24 ENCOUNTER — Encounter: Payer: Self-pay | Admitting: Vascular Surgery

## 2021-10-24 ENCOUNTER — Ambulatory Visit: Payer: BC Managed Care – PPO | Admitting: Vascular Surgery

## 2021-10-24 ENCOUNTER — Other Ambulatory Visit: Payer: Self-pay

## 2021-10-24 VITALS — BP 120/85 | HR 86 | Temp 97.3°F | Resp 14 | Ht 59.0 in | Wt 108.0 lb

## 2021-10-24 DIAGNOSIS — R1013 Epigastric pain: Secondary | ICD-10-CM

## 2021-10-24 NOTE — Progress Notes (Signed)
Patient name: Brittany Austin MRN: [id number] DOB: [birth year] Sex: female  REASON FOR CONSULT: Evaluate celiac artery stenosis with abdominal pain  HPI: Brittany Austin is a 46 y.o. female, with history of ITP that presents for evaluation of celiac artery stenosis.  She is referred by Dr. Hilarie Fredrickson with GI given chronic right upper quadrant abdominal pain with CT findings of compression of the celiac artery.  Patient states she has had pain more in the right upper quadrant since at least last year.  This is often intermittent.  She has not associated it with eating and has no postprandial pain.  She looks forward to eating.  She has been trying to lose weight and over the last 18 months has lost 90 pounds with pharmacologic therapy using Ozempic.  She works in [employer].  She previously had her gallbladder removed. Some question of constipation per GI note and has a BM about every 4 days and sometimes this provides relief and other times it can make her symptoms worse.  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  done in [place].    ESOPHAGOGASTRODUODENOSCOPY N/A 04/11/2013   Procedure: ESOPHAGOGASTRODUODENOSCOPY (EGD);  Surgeon: Jerene Bears, MD;  Location: [facility];  Service: Gastroenterology;  Laterality: N/A;   [place]  2009   for dysfunctional uterine bleeding   TUBAL LIGATION      Family History  Problem Relation Age of Onset    Hyperlipidemia Mother    Heart disease Mother    Hypertension Mother    Diabetes Mother    Hyperlipidemia Father    Diabetes Sister    Stroke Maternal Grandmother    Diabetes Maternal Grandmother    Other Maternal Grandmother        brain tumor   Heart disease Paternal Grandmother    Diabetes Paternal Grandmother    Cancer Paternal Grandmother    Cancer Paternal Grandfather        unknown source   Uterine cancer Paternal Aunt    Colon cancer Paternal Uncle    Arthritis Other     SOCIAL HISTORY: Social History   Socioeconomic History   Marital status: Single    Spouse name: Not on file   Number of children: 2   Years of education: Not on file   Highest education level: Not on file  Occupational History   Occupation: cafeteria and bus driver    Employer: [employer]  Tobacco Use   Smoking status: Never   Smokeless tobacco: Never  Substance and Sexual Activity   Alcohol use: No   Drug use: No   Sexual activity: Not Currently  Other Topics Concern   Not on file  Social History Narrative   Not on file   Social Determinants of Health   Financial Resource Strain: Not on file  Food Insecurity: Not on file  Transportation Needs: Not on file  Physical Activity: Not on file  Stress: Not on file  Social Connections: Not on file  Intimate Partner Violence: Not on file    No Known Allergies  Current Outpatient Medications  Medication Sig Dispense Refill   Cyanocobalamin (VITAMIN B 12 PO) Take by mouth.     MAGNESIUM PO Take by mouth.     Multiple Vitamin (MULTIVITAMIN) tablet Take 1 tablet by mouth daily.     naproxen (NAPROSYN) 500 MG tablet Take 500 mg by mouth as needed. Per patient taking only as needed     OZEMPIC, 1 MG/DOSE, 4 MG/3ML SOPN Inject 1 mg into the skin once a week.     tiZANidine (ZANAFLEX) 4 MG tablet Take 4 mg by mouth 2 (two) times daily.     No current facility-administered medications for this visit.    REVIEW OF SYSTEMS:  [X]   denotes positive finding, [ ]  denotes negative finding Cardiac  Comments:  Chest pain or chest pressure:    Shortness of breath upon exertion:    Short of breath when lying flat:    Irregular heart rhythm:        Vascular    Pain in calf, thigh, or hip brought on by ambulation:    Pain in feet at night that wakes you up from your sleep:     Blood clot in your veins:    Leg swelling:         Pulmonary    Oxygen at home:    Productive cough:     Wheezing:         Neurologic    Sudden weakness in arms or legs:     Sudden numbness in arms or legs:     Sudden onset of difficulty speaking or slurred speech:    Temporary loss of vision in one eye:     Problems with dizziness:         Gastrointestinal    Blood in stool:     Vomited blood:         Genitourinary    Burning when urinating:     Blood in urine:        Psychiatric    Major depression:         Hematologic    Bleeding problems:    Problems with blood clotting too easily:        Skin    Rashes or ulcers:        Constitutional    Fever or chills:      PHYSICAL EXAM: Vitals:   10/24/21 1141  BP: 120/85  Pulse: 86  Resp: 14  Temp: (!) 97.3 F (36.3 C)  TempSrc: Temporal  SpO2: 98%  Weight: 108 lb (49 kg)  Height: 4\' 11"  (1.499 m)    GENERAL: The patient is a well-nourished female, in no acute distress. The vital signs are documented above. CARDIAC: There is a regular rate and rhythm.  VASCULAR:  Bilateral femoral pulses palpable Bilateral DP pulses palpable PULMONARY: No respiratory distress. ABDOMEN: Soft and non-tender. MUSCULOSKELETAL: There are no major deformities or cyanosis. NEUROLOGIC: No focal weakness or paresthesias are detected. SKIN: There are no ulcers or rashes noted. PSYCHIATRIC: The patient has a normal affect.  DATA:   CTA 10/12/21     Assessment/Plan:  46 year old female presents for evaluation of intermittent abdominal pain more often in the right upper quadrant.   Vascular surgery was asked to  evaluate for celiac artery compression and possible underlying median arcuate ligament syndrome as a possible etiology.  As pictured above on her CT scan, she certainly has evidence of compression of the celiac artery consistent with external compression from the median arcuate ligament.  That being said she has no symptoms of median arcuate ligament syndrome including no postprandial abdominal pain or nausea or vomiting and overall looks forward to eating.  Hard to know about the weight loss since this has been intentional over the last 18 months with Ozempic.  I do not think there is any indication for surgical intervention at this time and I think other etiologies should be explored.  I discussed if her symptoms became much more postprandial and we were concerned this was median arcuate ligament syndrome we would need to get her to [facility] to see one of the laparoscopic surgeons and the treatment would generally be release of the median arcuate ligament and not stenting of the vessel.  Happy to see her in the future as I discussed with her today.   Marty Heck, MD [facility] Office: [phone]

## 2021-11-13 ENCOUNTER — Encounter: Payer: BC Managed Care – PPO | Admitting: Surgery

## 2022-08-10 IMAGING — CT CT CTA ABD/PEL W/CM AND/OR W/O CM
3 of 10 series · 11 of 46 positions shown, 17 images · IV contrast (OMNIPAQUE 350)
Comparison: CT AP, 04/07/2013. MR abdomen, 04/09/2013. Chest XR,
03/22/2011.

CLINICAL DATA: Several months hx of LLQ pain, epigastric and RUQ
pain. Hx of celiac artery compression.

EXAM:
CTA ABDOMEN AND PELVIS WITH CONTRAST
TECHNIQUE: Multidetector CT imaging of the abdomen and pelvis was performed
using the standard protocol during bolus administration of
intravenous contrast. Multiplanar reconstructed images and MIPs were
obtained and reviewed to evaluate the vascular anatomy.
CONTRAST:  80mL OMNIPAQUE IOHEXOL 350 MG/ML SOLN

[Series 4: arterial 3.0 br40 2 · axial · arterial · 0.62mm/px · z∈[-410,-356]mm · 2 of 135 slices shown]
[im 9/135  soft-tissue]
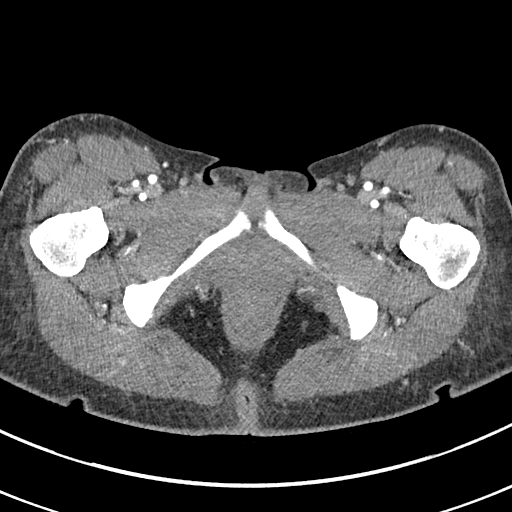
[im 27/135  soft-tissue]
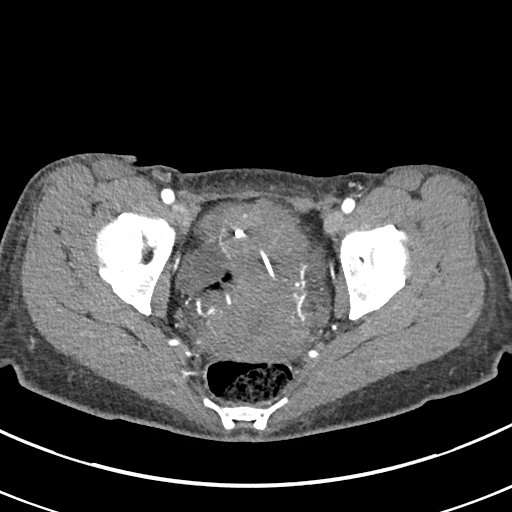

[Series 7: venous 5.0 bf37 2 · axial · portal-venous · 0.62mm/px · z∈[-382,-82]mm · 7 of 81 slices shown, 12 images]
[im 11/81  soft-tissue]
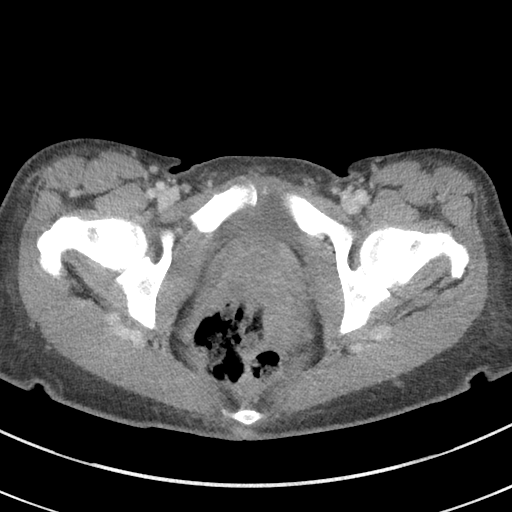
[im 11/81  bone]
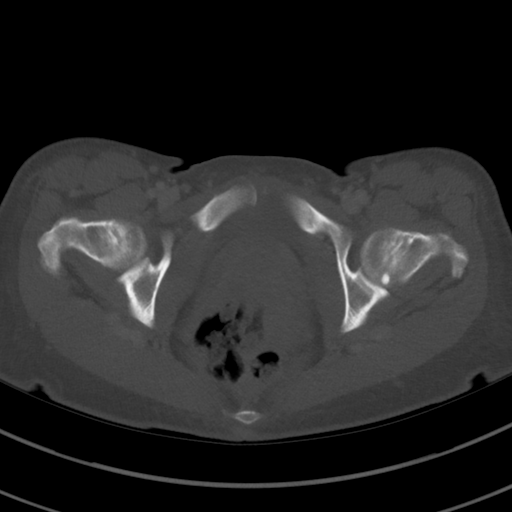
[im 21/81  soft-tissue]
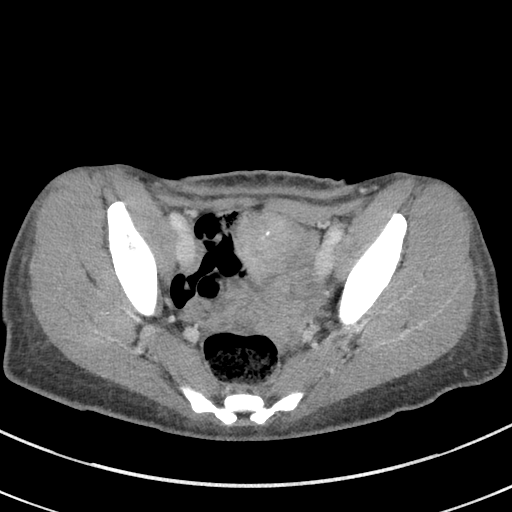
[im 31/81  soft-tissue]
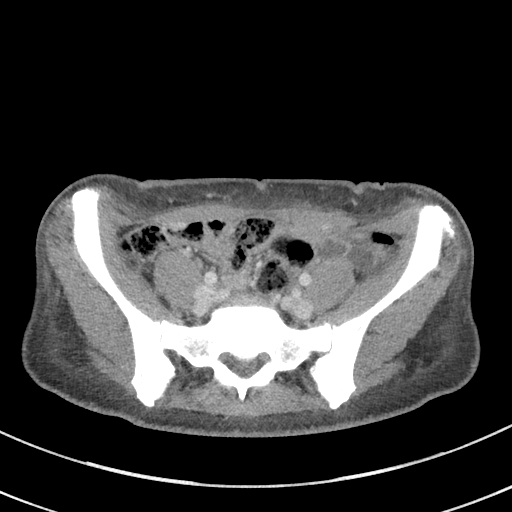
[im 41/81  soft-tissue]
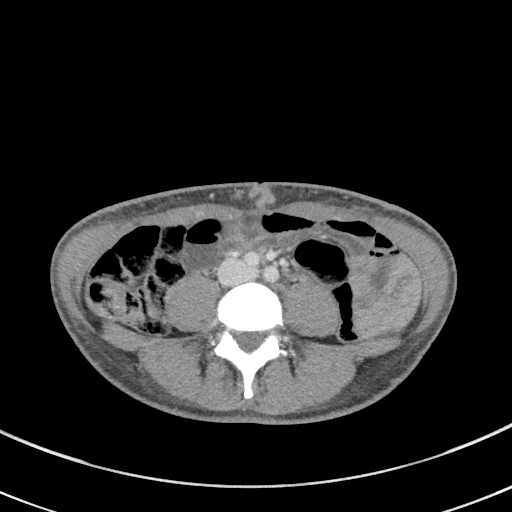
[im 41/81  lung]
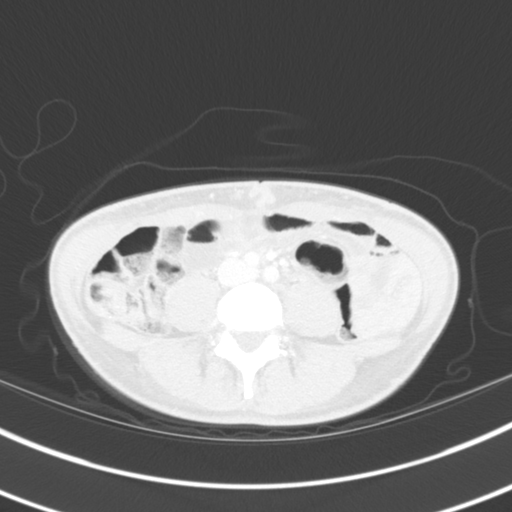
[im 51/81  soft-tissue]
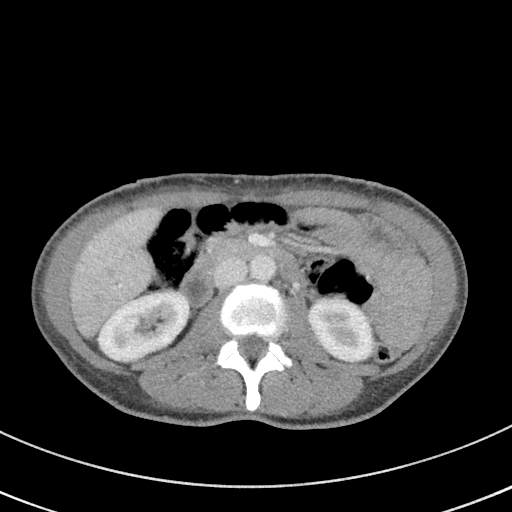
[im 51/81  lung]
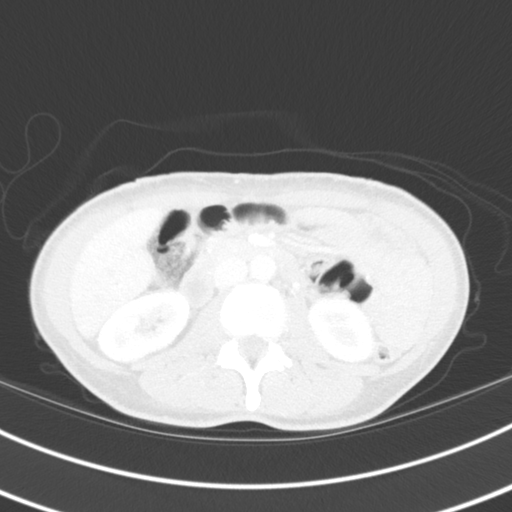
[im 61/81  soft-tissue]
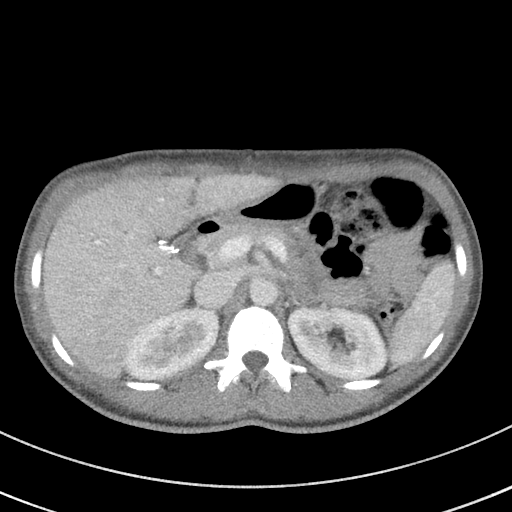
[im 61/81  lung]
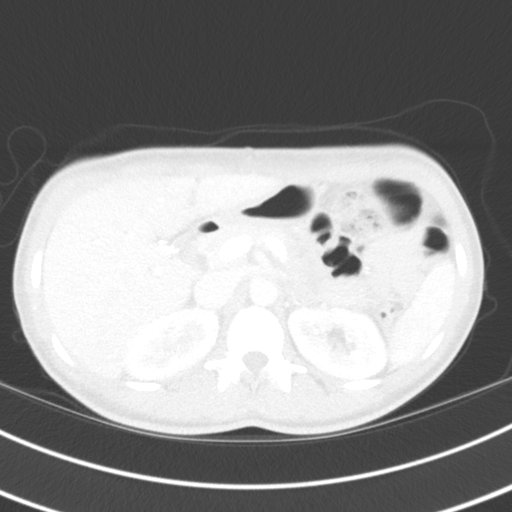
[im 71/81  soft-tissue]
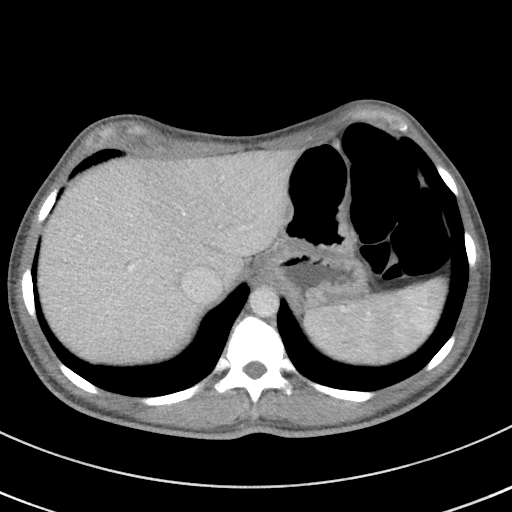
[im 71/81  lung]
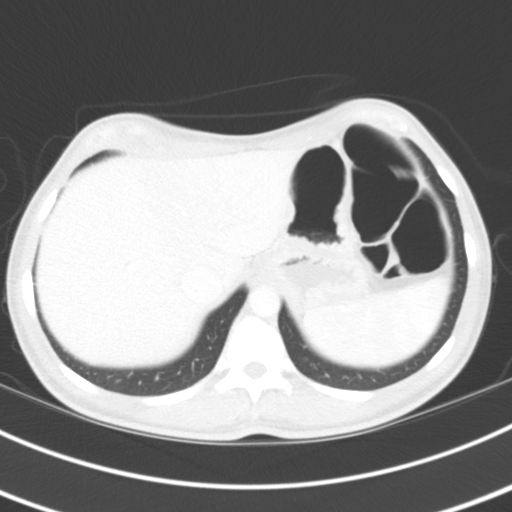

[Series 10: coronals · coronal · 0.59mm/px · 2 of 92 slices shown, 3 images]
[im 31/92  soft-tissue]
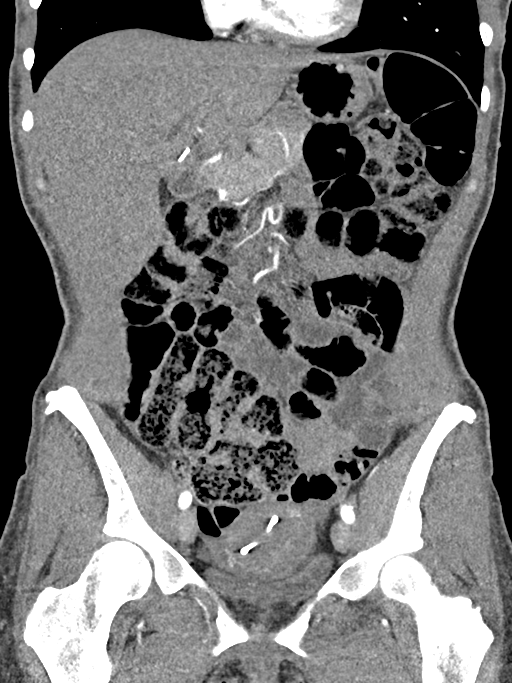
[im 31/92  bone]
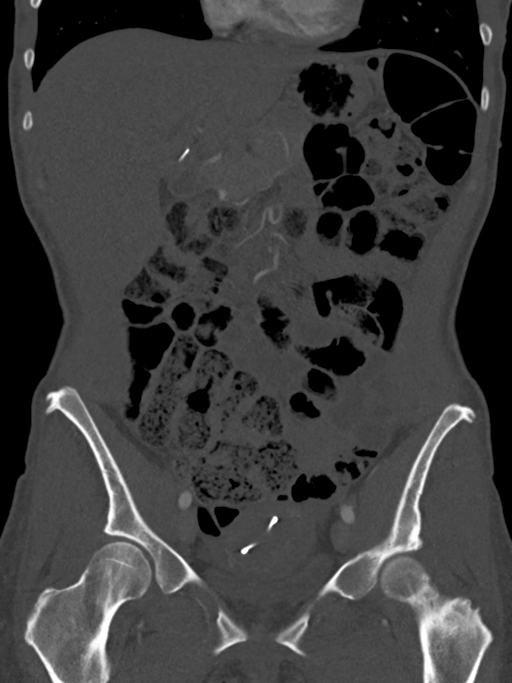
[im 61/92  soft-tissue]
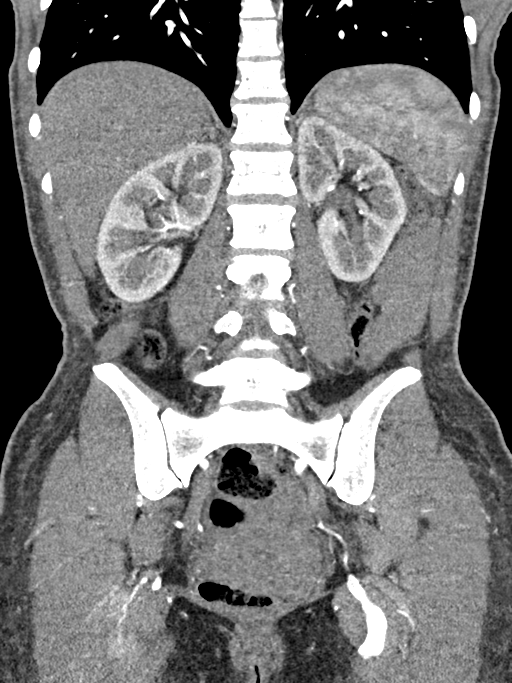

[11 of 46 positions shown; findings below may reference images not displayed]

FINDINGS: VASCULAR

Aorta: Normal caliber aorta without aneurysm, dissection, vasculitis
or significant stenosis.

Celiac:

Initial downward projection of the celiac trunk followed by a focal,
high-grade (> 70%) stenosis, then post stenotic dilation. See key
images.

The celiac trunk is otherwise patent without evidence of aneurysm,
dissection or vasculitis.

SMA: Patent without evidence of aneurysm, dissection, vasculitis or
significant stenosis.

Renals: Single renal arteries are present bilaterally. Both renal
arteries are patent without evidence of aneurysm, dissection,
vasculitis, fibromuscular dysplasia or significant stenosis.

IMA: Patent without evidence of aneurysm, dissection, vasculitis or
significant stenosis.

Inflow: Patent without evidence of aneurysm, dissection, vasculitis
or significant stenosis.

Proximal Outflow: Bilateral common femoral and visualized portions
of the superficial and profunda femoral arteries are patent without
evidence of aneurysm, dissection, vasculitis or significant
stenosis.

Veins: No obvious venous abnormality within the limitations of this
arterial phase study.

Review of the MIP images confirms the above findings.

NON-VASCULAR

Lower chest: No acute abnormality.

Hepatobiliary: No focal liver abnormality is seen. Status post
cholecystectomy. No biliary dilatation.

Pancreas: No pancreatic ductal dilatation or surrounding
inflammatory changes.

Spleen: Normal in size without focal abnormality.

Adrenals/Urinary Tract: Adrenal glands are unremarkable. Kidneys are
normal, without renal calculi, focal lesion, or hydronephrosis.
Bladder is unremarkable.

Stomach/Bowel: Stomach is within normal limits. Appendix appears
normal. No evidence of bowel wall thickening, distention, or
inflammatory changes.

Lymphatic: No enlarged abdominal or pelvic lymph nodes.

Reproductive: IUD. Uterus and bilateral adnexa are otherwise
unremarkable.

Other: No abdominal wall hernia or abnormality. No abdominopelvic
ascites.

Musculoskeletal: No acute or significant osseous findings.
IMPRESSION: VASCULAR

Focal, high-grade stenosis of the proximal celiac artery with
post-stenotic dilation.

Findings favored to represent extrinsic compression, likely from the
median arcuate ligament.

NON-VASCULAR

No acute abdominopelvic process.

## 2023-07-11 ENCOUNTER — Encounter: Payer: Self-pay | Admitting: Internal Medicine

## 2023-10-04 ENCOUNTER — Ambulatory Visit (AMBULATORY_SURGERY_CENTER): Payer: BC Managed Care – PPO | Admitting: *Deleted

## 2023-10-04 VITALS — Ht 59.0 in | Wt 131.0 lb

## 2023-10-04 DIAGNOSIS — Z1211 Encounter for screening for malignant neoplasm of colon: Secondary | ICD-10-CM

## 2023-10-04 MED ORDER — NA SULFATE-K SULFATE-MG SULF 17.5-3.13-1.6 GM/177ML PO SOLN: 1.0000 | Freq: Once | ORAL | 0 refills | Status: AC

## 2023-10-04 NOTE — Progress Notes (Signed)
Pre visit completed over telephone. Instructions forwarded through MyChart and mailed.  Patient advised to contact us if any changes in health, medications, or ER visits.  No egg or soy allergy known to patient  No issues known to pt with past sedation with any surgeries or procedures Patient denies ever being told they had issues or difficulty with intubation  No FH of Malignant Hyperthermia Pt is not on diet pills Pt is not on  home 02  Pt is not on blood thinners  Pt denies issues with constipation  No A fib or A flutter Have any cardiac testing pending--NO Pt instructed to use [email or web address].[email or web address] or GoodRx for a price reduction on prep

## 2023-10-23 ENCOUNTER — Encounter: Payer: Self-pay | Admitting: Internal Medicine

## 2023-10-25 ENCOUNTER — Encounter: Payer: Self-pay | Admitting: Internal Medicine

## 2023-10-25 ENCOUNTER — Ambulatory Visit (AMBULATORY_SURGERY_CENTER): Payer: 59 | Admitting: Internal Medicine

## 2023-10-25 VITALS — BP 117/74 | HR 67 | Temp 98.1°F | Resp 16 | Ht 59.0 in | Wt 131.0 lb

## 2023-10-25 DIAGNOSIS — Z1211 Encounter for screening for malignant neoplasm of colon: Secondary | ICD-10-CM | POA: Diagnosis present

## 2023-10-25 MED ORDER — SODIUM CHLORIDE 0.9 % IV SOLN: 500.0000 mL | Freq: Once | INTRAVENOUS | Status: DC

## 2023-10-25 NOTE — Op Note (Signed)
[facility] Patient Name: Brittany Austin Procedure Date: 10/25/2023 7:22 AM MRN: [id number] Endoscopist: Beverley Fiedler , MD, [id number] Age: 48 Referring MD:  Date of Birth: 1976-09-06 Gender: Female Account #: [id number] Procedure:                Colonoscopy Indications:              Screening for colorectal malignant neoplasm, This                            is the patient's first colonoscopy Medicines:                Monitored Anesthesia Care Procedure:                Pre-Anesthesia Assessment:                           - Prior to the procedure, a History and Physical                            was performed, and patient medications and                            allergies were reviewed. The patient's tolerance of                            previous anesthesia was also reviewed. The risks                            and benefits of the procedure and the sedation                            options and risks were discussed with the patient.                            All questions were answered, and informed consent                            was obtained. Prior Anticoagulants: The patient has                            taken no anticoagulant or antiplatelet agents. ASA                            Grade Assessment: II - A patient with mild systemic                            disease. After reviewing the risks and benefits,                            the patient was deemed in satisfactory condition to                            undergo the procedure.  After obtaining informed consent, the colonoscope                            was passed under direct vision. Throughout the                            procedure, the patient's blood pressure, pulse, and                            oxygen saturations were monitored continuously. The                            Olympus Scope SN: [id number] was introduced through                            the anus and advanced to  the cecum, identified by                            appendiceal orifice and ileocecal valve. The                            colonoscopy was performed without difficulty. The                            patient tolerated the procedure well. The quality                            of the bowel preparation was excellent. The                            ileocecal valve, appendiceal orifice, and rectum                            were photographed. Scope In: 8:05:37 AM Scope Out: 8:17:10 AM Scope Withdrawal Time: 0 hours 7 minutes 35 seconds  Total Procedure Duration: 0 hours 11 minutes 33 seconds  Findings:                 The digital rectal exam was normal.                           The entire examined colon appeared normal on direct                            and retroflexion views. Complications:            No immediate complications. Estimated Blood Loss:     Estimated blood loss: none. Impression:               - The entire examined colon is normal on direct and                            retroflexion views.                           - No specimens collected. Recommendation:           -  Patient has a contact number available for                            emergencies. The signs and symptoms of potential                            delayed complications were discussed with the                            patient. Return to normal activities tomorrow.                            Written discharge instructions were provided to the                            patient.                           - Resume previous diet.                           - Continue present medications.                           - Repeat colonoscopy in 10 years for screening                            purposes. Beverley Fiedler, MD 10/25/2023 8:33:55 AM This report has been signed electronically.

## 2023-10-25 NOTE — Patient Instructions (Addendum)
 Resume previous diet and medications.  Follow up colonoscopy in 10 years.    YOU HAD AN ENDOSCOPIC PROCEDURE TODAY AT THE [facility]:   Refer to the procedure report that was given to you for any specific questions about what was found during the examination.  If the procedure report does not answer your questions, please call your gastroenterologist to clarify.  If you requested that your care partner not be given the details of your procedure findings, then the procedure report has been included in a sealed envelope for you to review at your convenience later.  YOU SHOULD EXPECT: Some feelings of bloating in the abdomen. Passage of more gas than usual.  Walking can help get rid of the air that was put into your GI tract during the procedure and reduce the bloating. If you had a lower endoscopy (such as a colonoscopy or flexible sigmoidoscopy) you may notice spotting of blood in your stool or on the toilet paper. If you underwent a bowel prep for your procedure, you may not have a normal bowel movement for a few days.  Please Note:  You might notice some irritation and congestion in your nose or some drainage.  This is from the oxygen used during your procedure.  There is no need for concern and it should clear up in a day or so.  SYMPTOMS TO REPORT IMMEDIATELY:  Following lower endoscopy (colonoscopy or flexible sigmoidoscopy):  Excessive amounts of blood in the stool  Significant tenderness or worsening of abdominal pains  Swelling of the abdomen that is new, acute  Fever of 100F or higher  For urgent or emergent issues, a gastroenterologist can be reached at any hour by calling [phone]. Do not use MyChart messaging for urgent concerns.    DIET:  We do recommend a small meal at first, but then you may proceed to your regular diet.  Drink plenty of fluids but you should avoid alcoholic beverages for 24 hours.  ACTIVITY:  You should plan to take it easy for the rest of  today and you should NOT DRIVE or use heavy machinery until tomorrow (because of the sedation medicines used during the test).    FOLLOW UP: Our staff will call the number listed on your records the next business day following your procedure.  We will call around 7:15- 8:00 am to check on you and address any questions or concerns that you may have regarding the information given to you following your procedure. If we do not reach you, we will leave a message.     If any biopsies were taken you will be contacted by phone or by letter within the next 1-3 weeks.  Please call us at [phone] if you have not heard about the biopsies in 3 weeks.    SIGNATURES/CONFIDENTIALITY: You and/or your care partner have signed paperwork which will be entered into your electronic medical record.  These signatures attest to the fact that that the information above on your After Visit Summary has been reviewed and is understood.  Full responsibility of the confidentiality of this discharge information lies with you and/or your care-partner.

## 2023-10-25 NOTE — Progress Notes (Signed)
Pt's states no medical or surgical changes since previsit or office visit. 

## 2023-10-25 NOTE — Progress Notes (Signed)
GASTROENTEROLOGY PROCEDURE H&P NOTE   Primary Care Physician: Donia Ast, MD    Reason for Procedure:   Colon ca screening  Plan:    colonoscopy  Patient is appropriate for endoscopic procedure(s) in the ambulatory ([facility]) setting.  The nature of the procedure, as well as the risks, benefits, and alternatives were carefully and thoroughly reviewed with the patient. Ample time for discussion and questions allowed. The patient understood, was satisfied, and agreed to proceed.     HPI: Brittany Austin is a 48 y.o. female who presents for colonoscopy.  Medical history as below.  Tolerated the prep.  No recent chest pain or shortness of breath.  No abdominal pain today.  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  done in [place].    ESOPHAGOGASTRODUODENOSCOPY N/A 04/11/2013   Procedure: ESOPHAGOGASTRODUODENOSCOPY (EGD);  Surgeon: Beverley Fiedler, MD;  Location: [facility];  Service: Gastroenterology;  Laterality: N/A;   NOVASURE ABLATION  2009   for dysfunctional uterine bleeding   TUBAL LIGATION      Prior to Admission medications   Medication Sig Start Date End Date Taking? Authorizing Provider  Cyanocobalamin (VITAMIN B 12 PO) Take by mouth.   Yes [provider]  levonorgestrel (MIRENA, 52 MG,) 20 MCG/DAY IUD by Intrauterine route. 10/10/18  Yes [provider]  Multiple Vitamin (MULTIVITAMIN) tablet Take 1 tablet by mouth  daily.   Yes [provider]  tiZANidine (ZANAFLEX) 4 MG tablet Take 4 mg by mouth 2 (two) times daily. 09/22/21  Yes [provider]  naproxen (NAPROSYN) 500 MG tablet Take 500 mg by mouth as needed. Per patient taking only as needed 09/22/20   [provider]    Current Outpatient Medications  Medication Sig Dispense Refill   Cyanocobalamin (VITAMIN B 12 PO) Take by mouth.     levonorgestrel (MIRENA, 52 MG,) 20 MCG/DAY IUD by Intrauterine route.     Multiple Vitamin (MULTIVITAMIN) tablet Take 1 tablet by mouth daily.     tiZANidine (ZANAFLEX) 4 MG tablet Take 4 mg by mouth 2 (two) times daily.     naproxen (NAPROSYN) 500 MG tablet Take 500 mg by mouth as needed. Per patient taking only as needed     Current Facility-Administered Medications  Medication Dose Route Frequency Provider Last Rate Last Admin   0.9 %  sodium chloride infusion  500 mL Intravenous Once Terika Pillard, Carie Caddy, MD        Allergies as of 10/25/2023   (No Known Allergies)    Family History  Problem Relation Age of Onset   Hyperlipidemia Mother    Heart disease Mother    Hypertension Mother    Diabetes Mother    Hyperlipidemia Father    Diabetes Sister    Uterine cancer Paternal Aunt    Colon cancer Paternal Uncle  Stroke Maternal Grandmother    Diabetes Maternal Grandmother    Other Maternal Grandmother        brain tumor   Heart disease Paternal Grandmother    Diabetes Paternal Grandmother    Cancer Paternal Grandmother    Cancer Paternal Grandfather        unknown source   Arthritis Other    Esophageal cancer Neg Hx    Rectal cancer Neg Hx    Stomach cancer Neg Hx     Social History   Socioeconomic History   Marital status: Single    Spouse name: Not on file   Number of children: 2   Years of education: Not on file   Highest education level: Not on file  Occupational History   Occupation: cafeteria and bus driver    Employer: [employer]  Tobacco Use    Smoking status: Never   Smokeless tobacco: Never  Vaping Use   Vaping status: Never Used  Substance and Sexual Activity   Alcohol use: No   Drug use: No   Sexual activity: Not Currently  Other Topics Concern   Not on file  Social History Narrative   Not on file   Social Drivers of Health   Financial Resource Strain: Low Risk  (01/04/2022)   Received from [facility] visits prior to 12/08/2022., [facility]   Overall Financial Resource Strain (CARDIA)    Difficulty of Paying Living Expenses: Not very hard  Food Insecurity: Low Risk  (09/01/2023)   Received from [facility]   Hunger Vital Sign    Worried About Running Out of Food in the Last Year: Never true    Ran Out of Food in the Last Year: Never true  Transportation Needs: No Transportation Needs (09/01/2023)   Received from [facility]    In the past 12 months, has lack of reliable transportation kept you from medical appointments, meetings, work or from getting things needed for daily living? : No  Physical Activity: Insufficiently Active (01/04/2022)   Received [facility] visits prior to 12/08/2022., [facility]   Exercise Vital Sign    Days of Exercise per Week: 5 days    Minutes of Exercise per Session: 20 min  Stress: Stress Concern Present (01/04/2022)   Received from [facility] visits prior to 12/08/2022., [facility]   [employer] of Occupational Health - Occupational Stress Questionnaire    Feeling of Stress : To some extent  Social Connections: Unknown (01/04/2022)   Received from [facility] visits prior to 12/08/2022., [facility]   Social Connection and Isolation Panel [NHANES]    Frequency of Communication with Friends and Family: Three times a week    Frequency of Social Gatherings with Friends and Family: Patient declined    Attends Religious Services: Patient declined    Active Member of  Clubs or Organizations: No    Attends Banker Meetings: Patient declined    Marital Status: Living with partner  Intimate Partner Violence: Not At Risk (01/04/2022)   Received from [facility] visits prior to 12/08/2022.   Humiliation, Afraid, Rape, and Kick questionnaire    Fear of Current or Ex-Partner: No    Emotionally Abused: No    Physically Abused: No    Sexually Abused: No    Physical Exam: Vital signs in last 24 hours: @BP  120/74   Pulse 74  Temp 98.1 F (36.7 C) (Temporal)   Resp 15   Ht 4\' 11"  (1.499 m)   Wt 131 lb (59.4 kg)   SpO2 100%   BMI 26.46 kg/m  GEN: NAD EYE: Sclerae anicteric ENT: MMM CV: Non-tachycardic Pulm: CTA b/l GI: Soft, NT/ND NEURO:  Alert & Oriented x 3   Erick Blinks, MD [facility]  10/25/2023 8:03 AM

## 2023-10-25 NOTE — Progress Notes (Signed)
Report to PACU, RN, vss, BBS= Clear.  

## 2023-10-28 ENCOUNTER — Telehealth: Payer: Self-pay | Admitting: *Deleted

## 2023-10-28 NOTE — Telephone Encounter (Signed)
 Attempted post procedure follow up call.  No answer -LVM.
